# Patient Record
Sex: Female | Born: 1935 | Race: White | Hispanic: No | Marital: Married
Health system: Southern US, Community
[De-identification: ages and names within clinical notes are randomized; demographics above are authoritative.]

## PROBLEM LIST (undated history)

## (undated) DIAGNOSIS — R413 Other amnesia: Secondary | ICD-10-CM

## (undated) DIAGNOSIS — F32A Depression, unspecified: Secondary | ICD-10-CM

## (undated) DIAGNOSIS — I639 Cerebral infarction, unspecified: Secondary | ICD-10-CM

## (undated) DIAGNOSIS — F329 Major depressive disorder, single episode, unspecified: Secondary | ICD-10-CM

## (undated) DIAGNOSIS — R112 Nausea with vomiting, unspecified: Secondary | ICD-10-CM

## (undated) DIAGNOSIS — H5347 Heteronymous bilateral field defects: Secondary | ICD-10-CM

## (undated) DIAGNOSIS — I10 Essential (primary) hypertension: Secondary | ICD-10-CM

## (undated) DIAGNOSIS — N390 Urinary tract infection, site not specified: Secondary | ICD-10-CM

## (undated) DIAGNOSIS — R55 Syncope and collapse: Secondary | ICD-10-CM

## (undated) DIAGNOSIS — Z9889 Other specified postprocedural states: Secondary | ICD-10-CM

## (undated) HISTORY — PX: JOINT REPLACEMENT: SHX530

## (undated) HISTORY — PX: KNEE SURGERY: SHX244

## (undated) HISTORY — PX: ABDOMINAL HYSTERECTOMY: SHX81

---

## 2009-08-29 ENCOUNTER — Ambulatory Visit (HOSPITAL_COMMUNITY): Admission: RE | Admit: 2009-08-29 | Discharge: 2009-08-29 | Payer: Self-pay | Admitting: Orthopedic Surgery

## 2009-10-03 ENCOUNTER — Encounter: Payer: Self-pay | Admitting: Emergency Medicine

## 2009-10-03 ENCOUNTER — Ambulatory Visit: Payer: Self-pay | Admitting: Interventional Radiology

## 2009-10-04 ENCOUNTER — Encounter (INDEPENDENT_AMBULATORY_CARE_PROVIDER_SITE_OTHER): Payer: Self-pay | Admitting: Internal Medicine

## 2009-10-04 ENCOUNTER — Inpatient Hospital Stay (HOSPITAL_COMMUNITY): Admission: EM | Admit: 2009-10-04 | Discharge: 2009-10-04 | Payer: Self-pay

## 2009-10-07 ENCOUNTER — Encounter (INDEPENDENT_AMBULATORY_CARE_PROVIDER_SITE_OTHER): Payer: Self-pay | Admitting: Interventional Cardiology

## 2009-10-07 ENCOUNTER — Ambulatory Visit: Admission: RE | Admit: 2009-10-07 | Discharge: 2009-10-07 | Payer: Self-pay | Admitting: Interventional Cardiology

## 2009-10-07 ENCOUNTER — Ambulatory Visit: Payer: Self-pay | Admitting: Vascular Surgery

## 2010-08-03 ENCOUNTER — Encounter: Admission: RE | Admit: 2010-08-03 | Discharge: 2010-08-03 | Payer: Self-pay | Admitting: Geriatric Medicine

## 2010-09-17 ENCOUNTER — Ambulatory Visit (HOSPITAL_COMMUNITY): Admission: EM | Admit: 2010-09-17 | Discharge: 2010-01-22 | Payer: Self-pay | Admitting: Emergency Medicine

## 2010-09-24 ENCOUNTER — Ambulatory Visit (HOSPITAL_COMMUNITY)
Admission: EM | Admit: 2010-09-24 | Discharge: 2010-09-24 | Payer: Self-pay | Source: Home / Self Care | Admitting: Emergency Medicine

## 2010-12-30 LAB — BASIC METABOLIC PANEL
CO2: 24 mEq/L (ref 19–32)
Chloride: 105 mEq/L (ref 96–112)
GFR calc Af Amer: >60 mL/min (ref >60)
Sodium: 135 mEq/L (ref 135–145)

## 2010-12-30 LAB — COMPREHENSIVE METABOLIC PANEL
ALT: 22 U/L (ref 0–35)
AST: 29 U/L (ref 0–37)
Alkaline Phosphatase: 59 U/L (ref 39–117)
CO2: 22 mEq/L (ref 19–32)
Calcium: 9.2 mg/dL (ref 8.4–10.5)
Chloride: 102 mEq/L (ref 96–112)
GFR calc Af Amer: >60 mL/min (ref >60)
GFR calc non Af Amer: 59 mL/min — ABNORMAL LOW (ref >60)
Potassium: 4.1 mEq/L (ref 3.5–5.1)
Sodium: 134 mEq/L — ABNORMAL LOW (ref 135–145)

## 2010-12-30 LAB — DIFFERENTIAL
Eosinophils Absolute: 0.3 10*3/uL (ref 0.0–0.7)
Eosinophils Relative: 4 % (ref 0–5)
Lymphs Abs: 1.9 10*3/uL (ref 0.7–4.0)
Monocytes Relative: 9 % (ref 3–12)

## 2010-12-30 LAB — CBC
Hemoglobin: 11.1 g/dL — ABNORMAL LOW (ref 12.0–15.0)
MCHC: 34.3 g/dL (ref 30.0–36.0)
MCHC: 34.3 g/dL (ref 30.0–36.0)
MCV: 94.4 fL (ref 78.0–100.0)
RBC: 3.44 MIL/uL — ABNORMAL LOW (ref 3.87–5.11)
RBC: 3.91 MIL/uL (ref 3.87–5.11)
WBC: 7.9 10*3/uL (ref 4.0–10.5)

## 2010-12-30 LAB — TYPE AND SCREEN: ABO/RH(D): O POS

## 2011-01-11 LAB — URINALYSIS, ROUTINE W REFLEX MICROSCOPIC
Bilirubin Urine: NEGATIVE
Glucose, UA: NEGATIVE mg/dL
Hgb urine dipstick: NEGATIVE
Specific Gravity, Urine: 1.022 (ref 1.005–1.030)

## 2011-01-11 LAB — LIPID PANEL
HDL: 69 mg/dL (ref >39)
Total CHOL/HDL Ratio: 2 RATIO
Triglycerides: 61 mg/dL (ref <150)

## 2011-01-11 LAB — COMPREHENSIVE METABOLIC PANEL
ALT: 24 U/L (ref 0–35)
Albumin: 3.1 g/dL — ABNORMAL LOW (ref 3.5–5.2)
Alkaline Phosphatase: 56 U/L (ref 39–117)
CO2: 20 mEq/L (ref 19–32)
Calcium: 8.5 mg/dL (ref 8.4–10.5)
Chloride: 107 mEq/L (ref 96–112)
Glucose, Bld: 97 mg/dL (ref 70–99)
Total Protein: 5.5 g/dL — ABNORMAL LOW (ref 6.0–8.3)

## 2011-01-11 LAB — BASIC METABOLIC PANEL
CO2: 26 mEq/L (ref 19–32)
Calcium: 9 mg/dL (ref 8.4–10.5)
Chloride: 102 mEq/L (ref 96–112)
Creatinine, Ser: 1.2 mg/dL (ref 0.4–1.2)
Glucose, Bld: 98 mg/dL (ref 70–99)

## 2011-01-11 LAB — DIFFERENTIAL
Basophils Absolute: 0.1 10*3/uL (ref 0.0–0.1)
Basophils Relative: 1 % (ref 0–1)
Eosinophils Absolute: 0.3 10*3/uL (ref 0.0–0.7)
Monocytes Absolute: 0.5 10*3/uL (ref 0.1–1.0)
Monocytes Relative: 8 % (ref 3–12)
Neutro Abs: 3.9 10*3/uL (ref 1.7–7.7)
Neutrophils Relative %: 64 % (ref 43–77)

## 2011-01-11 LAB — URINE CULTURE

## 2011-01-11 LAB — FERRITIN: Ferritin: 97 ng/mL (ref 10–291)

## 2011-01-11 LAB — URINE MICROSCOPIC-ADD ON

## 2011-01-11 LAB — CBC
HCT: 31.1 % — ABNORMAL LOW (ref 36.0–46.0)
MCHC: 34.5 g/dL (ref 30.0–36.0)
MCV: 95.7 fL (ref 78.0–100.0)
MCV: 95.9 fL (ref 78.0–100.0)
RBC: 3.26 MIL/uL — ABNORMAL LOW (ref 3.87–5.11)
RBC: 3.62 MIL/uL — ABNORMAL LOW (ref 3.87–5.11)
RDW: 12.3 % (ref 11.5–15.5)
WBC: 6 10*3/uL (ref 4.0–10.5)

## 2011-01-11 LAB — CARDIAC PANEL(CRET KIN+CKTOT+MB+TROPI)
Relative Index: INVALID (ref 0.0–2.5)
Troponin I: 0.01 ng/mL (ref 0.00–0.06)

## 2011-01-11 LAB — VITAMIN B12: Vitamin B-12: 640 pg/mL (ref 211–911)

## 2011-01-11 LAB — RETICULOCYTES
RBC.: 3.4 MIL/uL — ABNORMAL LOW (ref 3.87–5.11)
Retic Count, Absolute: 37.4 10*3/uL (ref 19.0–186.0)
Retic Ct Pct: 1.1 % (ref 0.4–3.1)

## 2011-01-11 LAB — POCT CARDIAC MARKERS
CKMB, poc: 1.8 ng/mL (ref 1.0–8.0)
Myoglobin, poc: 81.9 ng/mL (ref 12–200)

## 2011-01-11 LAB — TSH: TSH: 2.059 u[IU]/mL (ref 0.350–4.500)

## 2011-01-11 LAB — IRON AND TIBC: UIBC: 219 ug/dL

## 2011-07-12 DIAGNOSIS — N390 Urinary tract infection, site not specified: Secondary | ICD-10-CM

## 2011-07-12 HISTORY — DX: Urinary tract infection, site not specified: N39.0

## 2011-08-30 ENCOUNTER — Encounter (HOSPITAL_COMMUNITY): Payer: Self-pay | Admitting: Gastroenterology

## 2011-08-30 ENCOUNTER — Ambulatory Visit (HOSPITAL_COMMUNITY)
Admission: RE | Admit: 2011-08-30 | Discharge: 2011-08-30 | Disposition: A | Discharge status: Hospice | Payer: Medicare Other | Source: Ambulatory Visit | Attending: Gastroenterology | Admitting: Gastroenterology

## 2011-08-30 ENCOUNTER — Encounter (HOSPITAL_COMMUNITY)
Admission: RE | Disposition: A | Discharge status: Hospice | Payer: Self-pay | Source: Ambulatory Visit | Attending: Gastroenterology

## 2011-08-30 DIAGNOSIS — R1310 Dysphagia, unspecified: Secondary | ICD-10-CM | POA: Insufficient documentation

## 2011-08-30 DIAGNOSIS — K222 Esophageal obstruction: Secondary | ICD-10-CM | POA: Insufficient documentation

## 2011-08-30 HISTORY — DX: Urinary tract infection, site not specified: N39.0

## 2011-08-30 HISTORY — DX: Cerebral infarction, unspecified: I63.9

## 2011-08-30 HISTORY — PX: BALLOON DILATION: SHX5330

## 2011-08-30 HISTORY — DX: Syncope and collapse: R55

## 2011-08-30 HISTORY — DX: Heteronymous bilateral field defects: H53.47

## 2011-08-30 HISTORY — DX: Essential (primary) hypertension: I10

## 2011-08-30 HISTORY — PX: ESOPHAGOGASTRODUODENOSCOPY: SHX5428

## 2011-08-30 HISTORY — DX: Major depressive disorder, single episode, unspecified: F32.9

## 2011-08-30 HISTORY — DX: Other amnesia: R41.3

## 2011-08-30 HISTORY — DX: Depression, unspecified: F32.A

## 2011-08-30 SURGERY — EGD (ESOPHAGOGASTRODUODENOSCOPY)
Anesthesia: Moderate Sedation

## 2011-08-30 MED ORDER — FENTANYL NICU IV SYRINGE 50 MCG/ML
INJECTION | INTRAMUSCULAR | Status: DC | PRN
  Administered 2011-08-30: 25 ug via INTRAVENOUS

## 2011-08-30 MED ORDER — BUTAMBEN-TETRACAINE-BENZOCAINE 2-2-14 % EX AERO
INHALATION_SPRAY | CUTANEOUS | Status: DC | PRN
  Administered 2011-08-30: 2 via TOPICAL

## 2011-08-30 MED ORDER — MIDAZOLAM HCL 10 MG/2ML IJ SOLN
INTRAMUSCULAR | Status: DC | PRN
  Administered 2011-08-30: 2 mg via INTRAVENOUS
  Administered 2011-08-30: 1 mg via INTRAVENOUS

## 2011-08-30 MED ORDER — MIDAZOLAM HCL 10 MG/2ML IJ SOLN
INTRAMUSCULAR | Status: AC
  Filled 2011-08-30: qty 4

## 2011-08-30 MED ORDER — SODIUM CHLORIDE 0.9 % IV SOLN
INTRAVENOUS | Status: DC
  Administered 2011-08-30: 14:00:00 via INTRAVENOUS

## 2011-08-30 MED ORDER — FENTANYL CITRATE 0.05 MG/ML IJ SOLN
INTRAMUSCULAR | Status: AC
  Filled 2011-08-30: qty 4

## 2011-08-30 MED ORDER — OMEPRAZOLE 20 MG PO CPDR: 20.0000 mg | DELAYED_RELEASE_CAPSULE | Freq: Every day | ORAL | Status: AC

## 2011-08-30 NOTE — Brief Op Note (Signed)
See endo pro for note.

## 2011-08-30 NOTE — H&P (Signed)
See H and P from August 27, 2011. No change.

## 2011-08-31 ENCOUNTER — Encounter (HOSPITAL_COMMUNITY): Payer: Self-pay

## 2011-09-07 ENCOUNTER — Encounter (HOSPITAL_COMMUNITY): Payer: Self-pay | Admitting: Gastroenterology

## 2011-10-26 DIAGNOSIS — H2510 Age-related nuclear cataract, unspecified eye: Secondary | ICD-10-CM | POA: Diagnosis not present

## 2011-11-08 DIAGNOSIS — IMO0002 Reserved for concepts with insufficient information to code with codable children: Secondary | ICD-10-CM | POA: Diagnosis not present

## 2011-11-08 DIAGNOSIS — H269 Unspecified cataract: Secondary | ICD-10-CM | POA: Diagnosis not present

## 2011-11-08 DIAGNOSIS — H2510 Age-related nuclear cataract, unspecified eye: Secondary | ICD-10-CM | POA: Diagnosis not present

## 2011-11-12 DIAGNOSIS — B351 Tinea unguium: Secondary | ICD-10-CM | POA: Diagnosis not present

## 2011-11-12 DIAGNOSIS — M79609 Pain in unspecified limb: Secondary | ICD-10-CM | POA: Diagnosis not present

## 2011-12-06 DIAGNOSIS — H2510 Age-related nuclear cataract, unspecified eye: Secondary | ICD-10-CM | POA: Diagnosis not present

## 2011-12-20 DIAGNOSIS — IMO0002 Reserved for concepts with insufficient information to code with codable children: Secondary | ICD-10-CM | POA: Diagnosis not present

## 2011-12-20 DIAGNOSIS — H2510 Age-related nuclear cataract, unspecified eye: Secondary | ICD-10-CM | POA: Diagnosis not present

## 2011-12-20 DIAGNOSIS — H269 Unspecified cataract: Secondary | ICD-10-CM | POA: Diagnosis not present

## 2012-01-14 DIAGNOSIS — M79609 Pain in unspecified limb: Secondary | ICD-10-CM | POA: Diagnosis not present

## 2012-01-14 DIAGNOSIS — B351 Tinea unguium: Secondary | ICD-10-CM | POA: Diagnosis not present

## 2012-03-28 DIAGNOSIS — Z79899 Other long term (current) drug therapy: Secondary | ICD-10-CM | POA: Diagnosis not present

## 2012-03-28 DIAGNOSIS — N951 Menopausal and female climacteric states: Secondary | ICD-10-CM | POA: Diagnosis not present

## 2012-03-28 DIAGNOSIS — R413 Other amnesia: Secondary | ICD-10-CM | POA: Diagnosis not present

## 2012-03-28 DIAGNOSIS — I10 Essential (primary) hypertension: Secondary | ICD-10-CM | POA: Diagnosis not present

## 2012-03-31 DIAGNOSIS — B351 Tinea unguium: Secondary | ICD-10-CM | POA: Diagnosis not present

## 2012-03-31 DIAGNOSIS — M79609 Pain in unspecified limb: Secondary | ICD-10-CM | POA: Diagnosis not present

## 2012-05-26 ENCOUNTER — Ambulatory Visit (HOSPITAL_COMMUNITY)
Admission: EM | Admit: 2012-05-26 | Discharge: 2012-05-26 | Disposition: A | Discharge status: Home / Self Care | Payer: Medicare Other | Attending: Gastroenterology | Admitting: Gastroenterology

## 2012-05-26 ENCOUNTER — Encounter (HOSPITAL_COMMUNITY): Payer: Self-pay | Admitting: Gastroenterology

## 2012-05-26 ENCOUNTER — Encounter (HOSPITAL_COMMUNITY): Admission: EM | Disposition: A | Discharge status: Home / Self Care | Payer: Self-pay | Source: Home / Self Care

## 2012-05-26 DIAGNOSIS — K449 Diaphragmatic hernia without obstruction or gangrene: Secondary | ICD-10-CM | POA: Insufficient documentation

## 2012-05-26 DIAGNOSIS — T18108A Unspecified foreign body in esophagus causing other injury, initial encounter: Secondary | ICD-10-CM | POA: Diagnosis not present

## 2012-05-26 DIAGNOSIS — K222 Esophageal obstruction: Secondary | ICD-10-CM | POA: Insufficient documentation

## 2012-05-26 DIAGNOSIS — IMO0002 Reserved for concepts with insufficient information to code with codable children: Secondary | ICD-10-CM | POA: Insufficient documentation

## 2012-05-26 HISTORY — PX: ESOPHAGOGASTRODUODENOSCOPY: SHX5428

## 2012-05-26 SURGERY — EGD (ESOPHAGOGASTRODUODENOSCOPY)
Anesthesia: Moderate Sedation

## 2012-05-26 MED ORDER — MIDAZOLAM HCL 10 MG/2ML IJ SOLN
INTRAMUSCULAR | Status: DC | PRN
  Administered 2012-05-26 (×3): 1 mg via INTRAVENOUS

## 2012-05-26 MED ORDER — SODIUM CHLORIDE 0.9 % IV SOLN: INTRAVENOUS | Status: DC

## 2012-05-26 MED ORDER — MIDAZOLAM HCL 5 MG/ML IJ SOLN
INTRAMUSCULAR | Status: AC
  Filled 2012-05-26: qty 2

## 2012-05-26 MED ORDER — FENTANYL CITRATE 0.05 MG/ML IJ SOLN
INTRAMUSCULAR | Status: AC
  Filled 2012-05-26: qty 2

## 2012-05-26 MED ORDER — SODIUM CHLORIDE 0.9 % IV SOLN
INTRAVENOUS | Status: DC
  Administered 2012-05-26: 250 mL via INTRAVENOUS

## 2012-05-26 MED ORDER — FENTANYL CITRATE 0.05 MG/ML IJ SOLN
INTRAMUSCULAR | Status: DC | PRN
  Administered 2012-05-26: 25 ug via INTRAVENOUS

## 2012-05-26 NOTE — H&P (Signed)
Interval history and physical for outpatient endoscopic procedure  The patient is an 78 year old female who was eating dinner this evening and then could not swallow. Her son who is a physician called me. The patient does have a history of having had a foreign body (meat impaction) removed from her esophagus with an esophageal dilatation about a year and a half ago.  She is in no acute distress  Heart regular rhythm no murmurs  Lungs clear  Abdomen: Soft nontender  Impression: Esophageal foreign body per history  Plan: upper endoscopy with foreign body removal and possible esophageal dilatation

## 2012-05-26 NOTE — Op Note (Signed)
Esophagogastroduodenoscopy with esophageal foreign body removal and esophageal dilatation  Indication: Dysphagia, esophageal foreign body, history of esophageal stricture  Premedications: Fentanyl 25 mcg IV, Versed 3 mg IV  Procedure: With the patient in the left lateral decubitus position the Pentax gastroscope was inserted into the oropharynx and passed into the esophagus. Midway down the esophagus there was food and liquid present. Much of this was able to be suctioned. I was able to pass the scope down to the distal esophagus getting around all of the food and then I could see the esophagogastric junction area which appeared to be strictured. I could not advance the scope into the stomach. I therefore advanced a balloon dilator and placed at the stricture at the EG junction and dilated to 10 mm. I was unable to get the scope into the stomach and down into the duodenum. The second portion of bulb of the duodenum were normal. The stomach had normal-appearing mucosa. There was a moderate-sized hiatal hernia noted on retroflexion. The scope was then brought back up into the esophagus where there was still a fair amount of food present in the esophagus was cleaned out watching the food and pushing the food down into the stomach. The esophagus was cleared.  Impression: Meat impaction and food impaction in the esophagus                      Distal esophageal stricture dilated to 10 mm                       Hiatal hernia  Plan: I recommended to the patient's husband and family that she should not eat chunks of meat. She should basically eat softer foods that will pass easily. Consider elective repeat esophageal dilatation.

## 2012-05-29 ENCOUNTER — Encounter (HOSPITAL_COMMUNITY): Payer: Self-pay | Admitting: Gastroenterology

## 2012-05-29 ENCOUNTER — Encounter (HOSPITAL_COMMUNITY): Payer: Self-pay

## 2012-06-02 DIAGNOSIS — M79609 Pain in unspecified limb: Secondary | ICD-10-CM | POA: Diagnosis not present

## 2012-06-02 DIAGNOSIS — B351 Tinea unguium: Secondary | ICD-10-CM | POA: Diagnosis not present

## 2012-07-19 DIAGNOSIS — L259 Unspecified contact dermatitis, unspecified cause: Secondary | ICD-10-CM | POA: Diagnosis not present

## 2012-07-24 DIAGNOSIS — H353190 Nonexudative age-related macular degeneration, unspecified eye, stage unspecified: Secondary | ICD-10-CM | POA: Diagnosis not present

## 2012-08-04 DIAGNOSIS — B351 Tinea unguium: Secondary | ICD-10-CM | POA: Diagnosis not present

## 2012-08-04 DIAGNOSIS — M79609 Pain in unspecified limb: Secondary | ICD-10-CM | POA: Diagnosis not present

## 2012-10-06 DIAGNOSIS — B351 Tinea unguium: Secondary | ICD-10-CM | POA: Diagnosis not present

## 2012-10-06 DIAGNOSIS — M79609 Pain in unspecified limb: Secondary | ICD-10-CM | POA: Diagnosis not present

## 2012-10-09 DIAGNOSIS — Z0000 Encounter for general adult medical examination without abnormal findings: Secondary | ICD-10-CM | POA: Diagnosis not present

## 2012-10-09 DIAGNOSIS — Z79899 Other long term (current) drug therapy: Secondary | ICD-10-CM | POA: Diagnosis not present

## 2012-10-09 DIAGNOSIS — Z1331 Encounter for screening for depression: Secondary | ICD-10-CM | POA: Diagnosis not present

## 2012-10-09 DIAGNOSIS — R413 Other amnesia: Secondary | ICD-10-CM | POA: Diagnosis not present

## 2012-10-09 DIAGNOSIS — R197 Diarrhea, unspecified: Secondary | ICD-10-CM | POA: Diagnosis not present

## 2012-11-03 ENCOUNTER — Other Ambulatory Visit (HOSPITAL_BASED_OUTPATIENT_CLINIC_OR_DEPARTMENT_OTHER): Payer: Self-pay | Admitting: Family Medicine

## 2012-11-03 ENCOUNTER — Ambulatory Visit (HOSPITAL_BASED_OUTPATIENT_CLINIC_OR_DEPARTMENT_OTHER)
Admission: RE | Admit: 2012-11-03 | Discharge: 2012-11-03 | Disposition: A | Discharge status: Home / Self Care | Payer: Medicare Other | Source: Ambulatory Visit | Attending: Family Medicine | Admitting: Family Medicine

## 2012-11-03 DIAGNOSIS — S92919A Unspecified fracture of unspecified toe(s), initial encounter for closed fracture: Secondary | ICD-10-CM | POA: Diagnosis not present

## 2012-11-03 DIAGNOSIS — S1093XA Contusion of unspecified part of neck, initial encounter: Secondary | ICD-10-CM | POA: Diagnosis not present

## 2012-11-03 DIAGNOSIS — S99929A Unspecified injury of unspecified foot, initial encounter: Secondary | ICD-10-CM | POA: Diagnosis not present

## 2012-11-03 DIAGNOSIS — S0003XA Contusion of scalp, initial encounter: Secondary | ICD-10-CM | POA: Diagnosis not present

## 2012-11-03 DIAGNOSIS — S8990XA Unspecified injury of unspecified lower leg, initial encounter: Secondary | ICD-10-CM | POA: Diagnosis not present

## 2012-11-03 DIAGNOSIS — S0083XA Contusion of other part of head, initial encounter: Secondary | ICD-10-CM | POA: Diagnosis not present

## 2012-11-03 DIAGNOSIS — IMO0002 Reserved for concepts with insufficient information to code with codable children: Secondary | ICD-10-CM | POA: Diagnosis not present

## 2012-11-03 DIAGNOSIS — X58XXXA Exposure to other specified factors, initial encounter: Secondary | ICD-10-CM | POA: Insufficient documentation

## 2012-12-08 DIAGNOSIS — M79609 Pain in unspecified limb: Secondary | ICD-10-CM | POA: Diagnosis not present

## 2013-01-04 ENCOUNTER — Ambulatory Visit
Admission: RE | Admit: 2013-01-04 | Discharge: 2013-01-04 | Disposition: A | Discharge status: Home / Self Care | Payer: Medicare Other | Source: Ambulatory Visit | Attending: Family Medicine | Admitting: Family Medicine

## 2013-01-04 ENCOUNTER — Other Ambulatory Visit: Payer: Self-pay | Admitting: Family Medicine

## 2013-01-04 DIAGNOSIS — R1310 Dysphagia, unspecified: Secondary | ICD-10-CM

## 2013-01-04 DIAGNOSIS — K228 Other specified diseases of esophagus: Secondary | ICD-10-CM | POA: Diagnosis not present

## 2013-01-05 DIAGNOSIS — K222 Esophageal obstruction: Secondary | ICD-10-CM | POA: Diagnosis not present

## 2013-01-10 ENCOUNTER — Other Ambulatory Visit: Payer: Self-pay | Admitting: Gastroenterology

## 2013-01-11 ENCOUNTER — Encounter (HOSPITAL_COMMUNITY): Payer: Self-pay

## 2013-01-11 ENCOUNTER — Ambulatory Visit (HOSPITAL_COMMUNITY)
Admission: RE | Admit: 2013-01-11 | Discharge: 2013-01-11 | Disposition: A | Discharge status: Home / Self Care | Payer: Medicare Other | Source: Ambulatory Visit | Attending: Gastroenterology | Admitting: Gastroenterology

## 2013-01-11 ENCOUNTER — Encounter (HOSPITAL_COMMUNITY)
Admission: RE | Disposition: A | Discharge status: Home / Self Care | Payer: Self-pay | Source: Ambulatory Visit | Attending: Gastroenterology

## 2013-01-11 DIAGNOSIS — K449 Diaphragmatic hernia without obstruction or gangrene: Secondary | ICD-10-CM | POA: Diagnosis not present

## 2013-01-11 DIAGNOSIS — K222 Esophageal obstruction: Secondary | ICD-10-CM | POA: Diagnosis not present

## 2013-01-11 DIAGNOSIS — R1310 Dysphagia, unspecified: Secondary | ICD-10-CM | POA: Insufficient documentation

## 2013-01-11 HISTORY — DX: Other specified postprocedural states: R11.2

## 2013-01-11 HISTORY — PX: ESOPHAGOGASTRODUODENOSCOPY: SHX5428

## 2013-01-11 HISTORY — DX: Other specified postprocedural states: Z98.890

## 2013-01-11 HISTORY — PX: BALLOON DILATION: SHX5330

## 2013-01-11 SURGERY — EGD (ESOPHAGOGASTRODUODENOSCOPY)
Anesthesia: Moderate Sedation

## 2013-01-11 MED ORDER — SODIUM CHLORIDE 0.9 % IV SOLN: INTRAVENOUS | Status: DC

## 2013-01-11 MED ORDER — FENTANYL CITRATE 0.05 MG/ML IJ SOLN
INTRAMUSCULAR | Status: DC | PRN
  Administered 2013-01-11: 25 ug via INTRAVENOUS

## 2013-01-11 MED ORDER — BUTAMBEN-TETRACAINE-BENZOCAINE 2-2-14 % EX AERO
INHALATION_SPRAY | CUTANEOUS | Status: DC | PRN
  Administered 2013-01-11: 2 via TOPICAL

## 2013-01-11 MED ORDER — FENTANYL CITRATE 0.05 MG/ML IJ SOLN
INTRAMUSCULAR | Status: AC
  Filled 2013-01-11: qty 4

## 2013-01-11 MED ORDER — MIDAZOLAM HCL 10 MG/2ML IJ SOLN
INTRAMUSCULAR | Status: DC | PRN
  Administered 2013-01-11 (×2): 1 mg via INTRAVENOUS
  Administered 2013-01-11: 2 mg via INTRAVENOUS

## 2013-01-11 MED ORDER — MIDAZOLAM HCL 10 MG/2ML IJ SOLN
INTRAMUSCULAR | Status: AC
  Filled 2013-01-11: qty 4

## 2013-01-11 NOTE — Op Note (Signed)
[facility] [address] [place], [address]   ENDOSCOPY PROCEDURE REPORT  PATIENT: Linda Raymond, Linda Raymond  MR#: [id number] BIRTHDATE: March 30, 1922 , 89  yrs. old GENDER: Female ENDOSCOPIST: Wandalee Ferdinand, MD REFERRED BY: PROCEDURE DATE:  01/11/2013 PROCEDURE: ASA CLASS: 3 INDICATIONS: dysphagia, distal esophageal stenosis MEDICATIONS: fentanyl 25 mcg IV, Versed 4 mg IV TOPICAL ANESTHETIC: Cetacaine spray  DESCRIPTION OF PROCEDURE:   After the risks benefits and alternatives of the procedure were thoroughly explained, informed consent was obtained.  The Pentax       endoscope was introduced through the mouth and advanced to the second portion of the duodenum      , limited by Without limitations.   The instrument was slowly withdrawn as the mucosa was fully examined.      FINDINGS:  At 35 cm from the incisor teeth region the esophageal lumen narrowed. This looked like a smooth ring like narrowing. There was a little resistance to the passage of the scope, however I was able to get the scope into the stomach. The scope was then advanced down into the duodenum. There were no lesions seen in the duodenum or in the stomach. There was a hiatal hernia. The scope was then brought back up and an endoscopic balloon dilator was advanced down the scope and appropriately placed at the level of the stenosis. Dilation to 10 mm, then 12 mm was done. The balloon dilator was held in place at each level for 1 minute. Of note is that 12 mm is the level that I took her to the last time in 2012, and she did well after that.  COMPLICATIONS:none  ENDOSCOPIC IMPRESSION:distal esophageal stenosis dilated to 12 mm   RECOMMENDATIONS:observation and see what the responses to the dilatation.   REPEAT EXAM: when necessary   _______________________________ Rhodia Albright, MD 01/11/2013 10:44 AM

## 2013-01-11 NOTE — H&P (Signed)
History: The patient is an 79 year old female with a history of dysphagia, and also a history of a distal esophageal stricture. She presents today for outpatient EGD with balloon dilatation of the distal esophageal stricture.  Physical: She is in no distress  Heart regular rhythm no murmurs  Lungs clear  Abdomen is soft and nontender  Impression: Distal esophageal stricture with dysphagia.  Plan: EGD with dilatation

## 2013-01-12 ENCOUNTER — Encounter (HOSPITAL_COMMUNITY): Payer: Self-pay | Admitting: Gastroenterology

## 2013-01-12 NOTE — Addendum Note (Signed)
Addended by: Herbert Moors on: 01/12/2013 10:00 AM   Modules accepted: Orders

## 2013-02-09 DIAGNOSIS — B351 Tinea unguium: Secondary | ICD-10-CM | POA: Diagnosis not present

## 2013-02-09 DIAGNOSIS — M79609 Pain in unspecified limb: Secondary | ICD-10-CM | POA: Diagnosis not present

## 2013-04-09 DIAGNOSIS — I10 Essential (primary) hypertension: Secondary | ICD-10-CM | POA: Diagnosis not present

## 2013-04-09 DIAGNOSIS — Z79899 Other long term (current) drug therapy: Secondary | ICD-10-CM | POA: Diagnosis not present

## 2013-04-09 DIAGNOSIS — R413 Other amnesia: Secondary | ICD-10-CM | POA: Diagnosis not present

## 2013-04-13 DIAGNOSIS — B351 Tinea unguium: Secondary | ICD-10-CM | POA: Diagnosis not present

## 2013-04-13 DIAGNOSIS — M79609 Pain in unspecified limb: Secondary | ICD-10-CM | POA: Diagnosis not present

## 2013-07-23 DIAGNOSIS — Z23 Encounter for immunization: Secondary | ICD-10-CM | POA: Diagnosis not present

## 2013-08-10 DIAGNOSIS — B351 Tinea unguium: Secondary | ICD-10-CM | POA: Diagnosis not present

## 2013-08-10 DIAGNOSIS — M79609 Pain in unspecified limb: Secondary | ICD-10-CM | POA: Diagnosis not present

## 2013-10-19 DIAGNOSIS — B351 Tinea unguium: Secondary | ICD-10-CM | POA: Diagnosis not present

## 2013-10-19 DIAGNOSIS — M79609 Pain in unspecified limb: Secondary | ICD-10-CM | POA: Diagnosis not present

## 2013-10-26 DIAGNOSIS — Z0000 Encounter for general adult medical examination without abnormal findings: Secondary | ICD-10-CM | POA: Diagnosis not present

## 2013-10-26 DIAGNOSIS — L989 Disorder of the skin and subcutaneous tissue, unspecified: Secondary | ICD-10-CM | POA: Diagnosis not present

## 2013-10-26 DIAGNOSIS — Z79899 Other long term (current) drug therapy: Secondary | ICD-10-CM | POA: Diagnosis not present

## 2013-10-26 DIAGNOSIS — F3289 Other specified depressive episodes: Secondary | ICD-10-CM | POA: Diagnosis not present

## 2013-10-26 DIAGNOSIS — F329 Major depressive disorder, single episode, unspecified: Secondary | ICD-10-CM | POA: Diagnosis not present

## 2013-10-26 DIAGNOSIS — F0280 Dementia in other diseases classified elsewhere without behavioral disturbance: Secondary | ICD-10-CM | POA: Diagnosis not present

## 2013-12-21 DIAGNOSIS — B351 Tinea unguium: Secondary | ICD-10-CM | POA: Diagnosis not present

## 2013-12-21 DIAGNOSIS — M79609 Pain in unspecified limb: Secondary | ICD-10-CM | POA: Diagnosis not present

## 2014-03-01 DIAGNOSIS — M79609 Pain in unspecified limb: Secondary | ICD-10-CM | POA: Diagnosis not present

## 2014-03-01 DIAGNOSIS — B351 Tinea unguium: Secondary | ICD-10-CM | POA: Diagnosis not present

## 2014-04-26 DIAGNOSIS — G309 Alzheimer's disease, unspecified: Secondary | ICD-10-CM | POA: Diagnosis not present

## 2014-04-26 DIAGNOSIS — F0280 Dementia in other diseases classified elsewhere without behavioral disturbance: Secondary | ICD-10-CM | POA: Diagnosis not present

## 2014-05-10 DIAGNOSIS — B351 Tinea unguium: Secondary | ICD-10-CM | POA: Diagnosis not present

## 2014-05-10 DIAGNOSIS — M79609 Pain in unspecified limb: Secondary | ICD-10-CM | POA: Diagnosis not present

## 2014-06-20 DIAGNOSIS — J384 Edema of larynx: Secondary | ICD-10-CM | POA: Diagnosis not present

## 2014-06-20 DIAGNOSIS — T6391XA Toxic effect of contact with unspecified venomous animal, accidental (unintentional), initial encounter: Secondary | ICD-10-CM | POA: Diagnosis not present

## 2014-09-20 DIAGNOSIS — M79675 Pain in left toe(s): Secondary | ICD-10-CM | POA: Diagnosis not present

## 2014-09-20 DIAGNOSIS — B351 Tinea unguium: Secondary | ICD-10-CM | POA: Diagnosis not present

## 2014-10-03 NOTE — Addendum Note (Signed)
Addended by: Prajna Vanderpool on: 10/03/2014 10:40 AM   Modules accepted: Orders  

## 2014-10-05 IMAGING — CT CT MAXILLOFACIAL W/O CM
3 series · 16 of 47 positions shown, 19 images · non-contrast
Comparison: Head CT 10/03/2009.

CLINICAL DATA: Fell.  Large hematoma over the right maxilla area.

CT MAXILLOFACIAL WITHOUT CONTRAST
TECHNIQUE: Multidetector CT imaging of the maxillofacial
structures was performed. Multiplanar CT image reconstructions were
also generated.

[Series 3: maxillofacial 2.0 h30s st · axial · 0.33mm/px · z∈[+1108,+1250]mm · 10 of 83 slices shown, 13 images]
[im 6/83  brain]
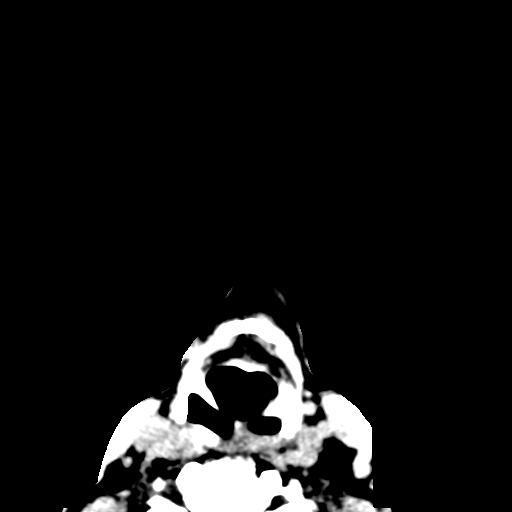
[im 6/83  bone]
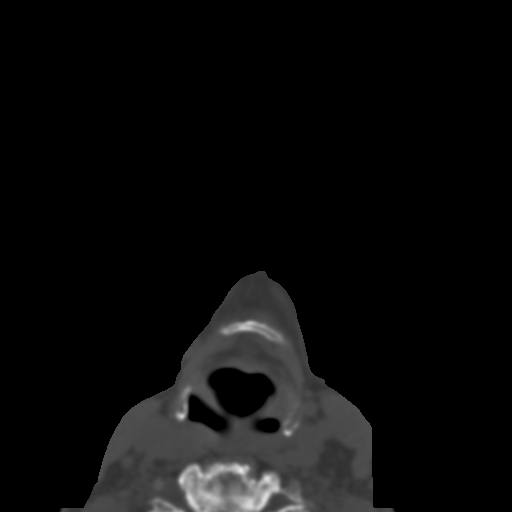
[im 15/83  bone]
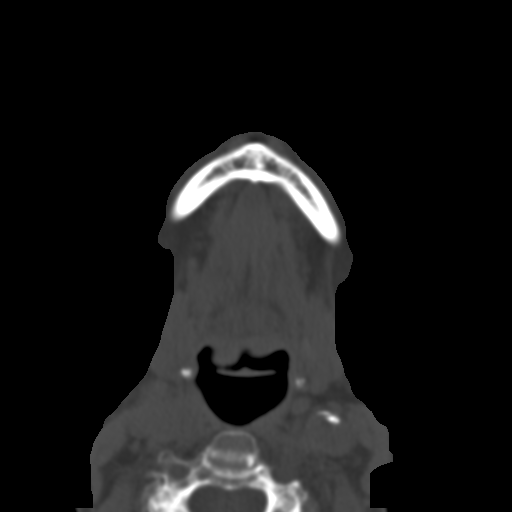
[im 23/83  bone]
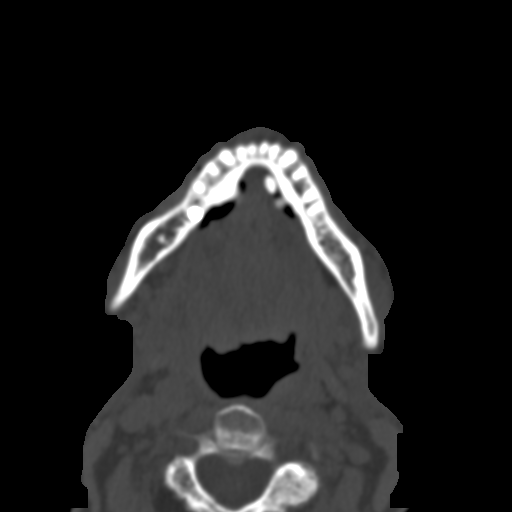
[im 29/83  bone]
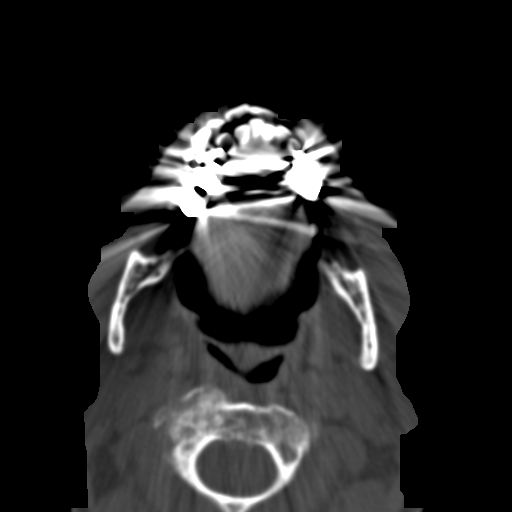
[im 37/83  brain]
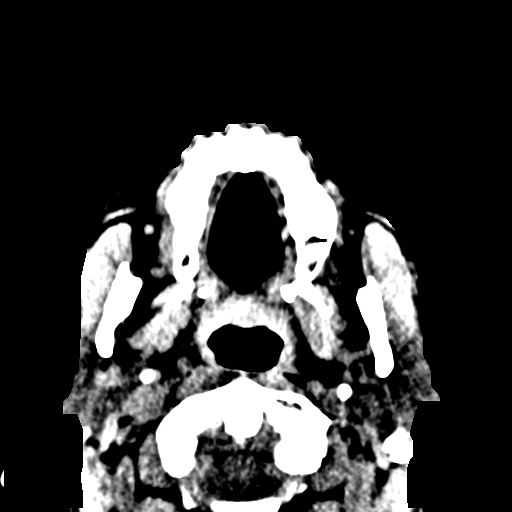
[im 37/83  bone]
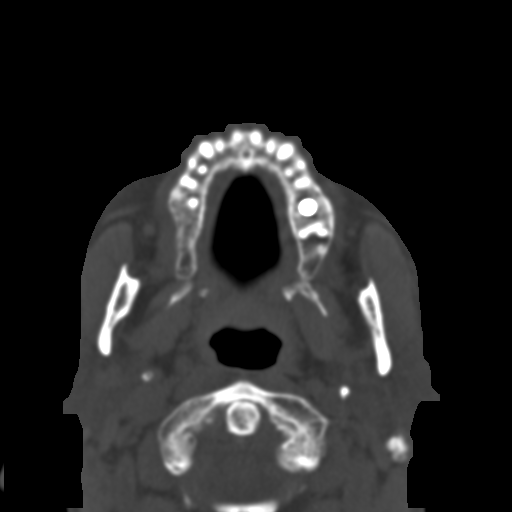
[im 46/83  bone]
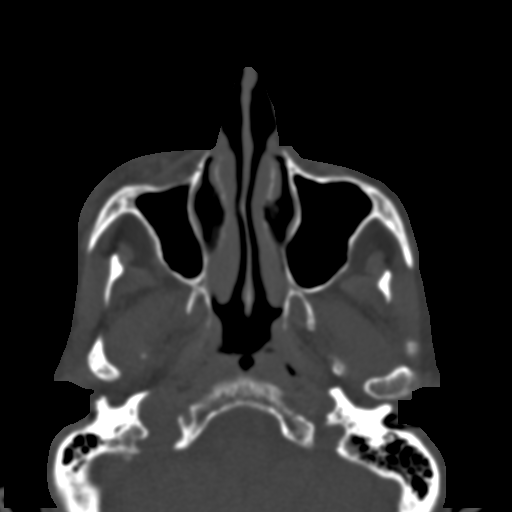
[im 54/83  bone]
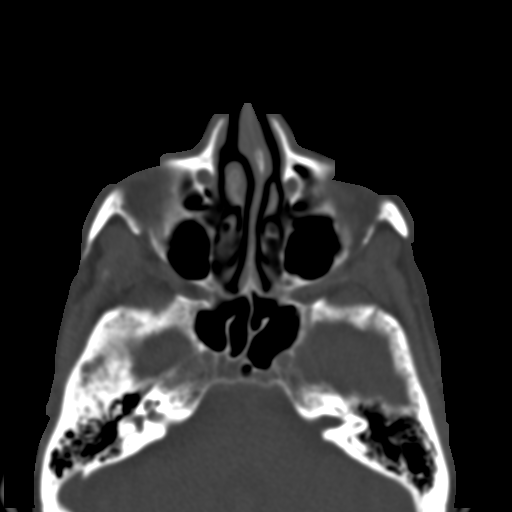
[im 63/83  bone]
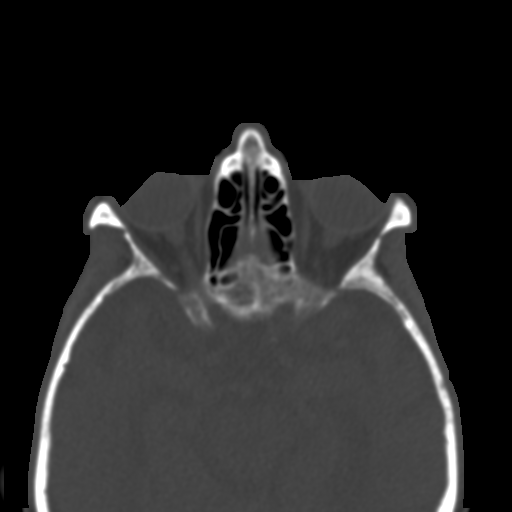
[im 68/83  brain]
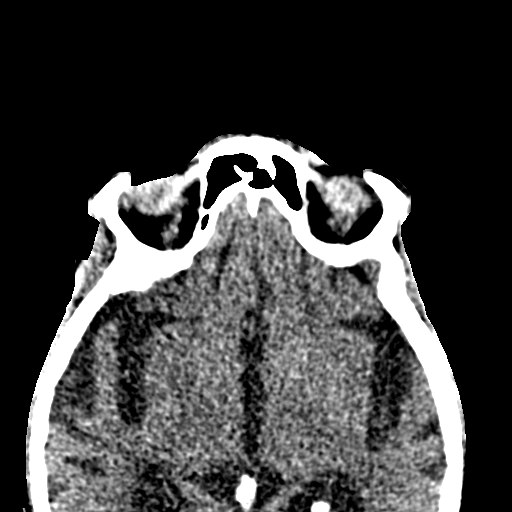
[im 68/83  bone]
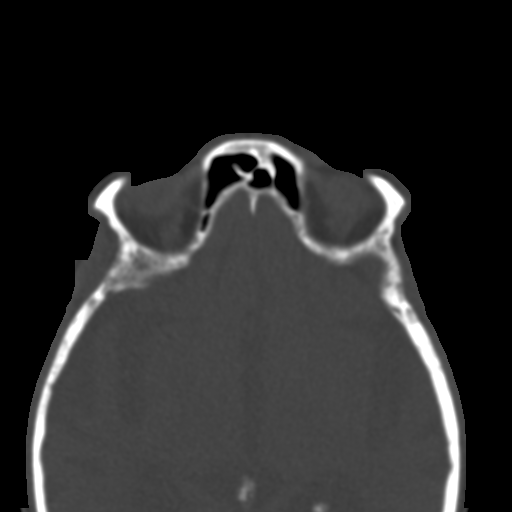
[im 77/83  bone]
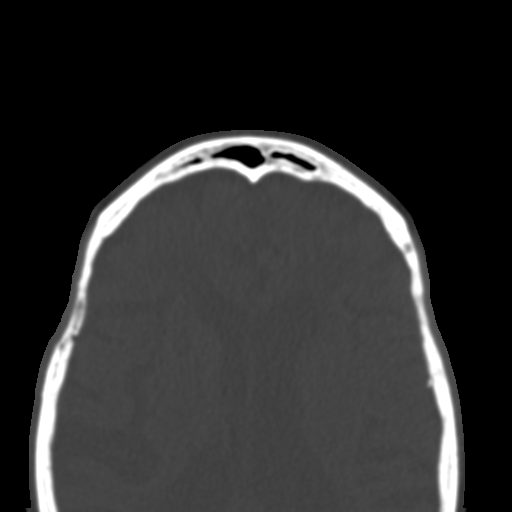

[Series 8: maxillofacial 2.0 coronal · coronal · 0.32mm/px · 3 of 78 slices shown]
[im 26/78  bone]
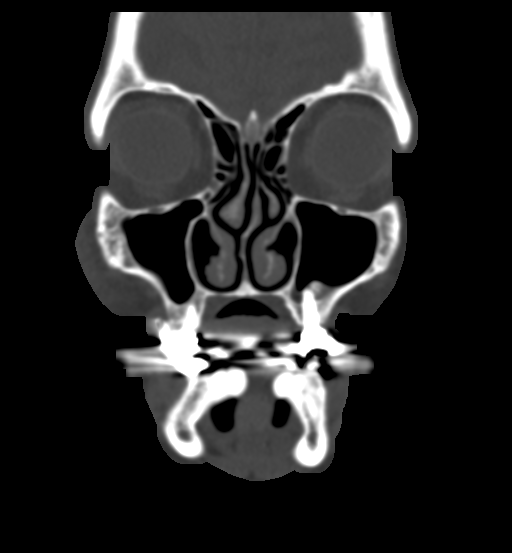
[im 35/78  bone]
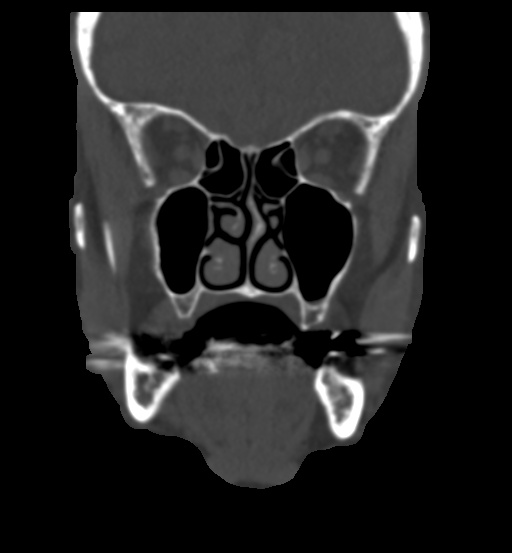
[im 43/78  bone]
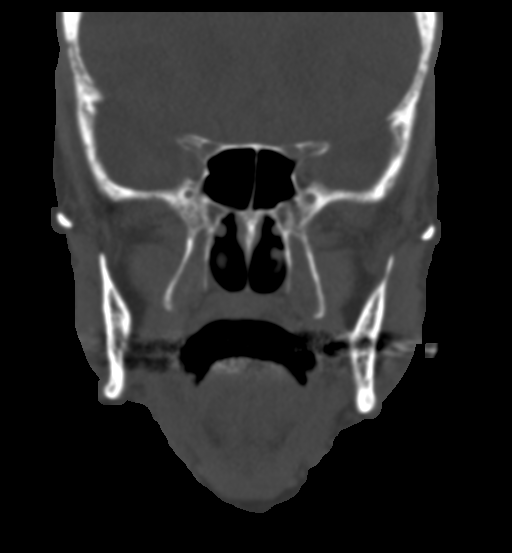

[Series 9: maxillofacial 2.0 sagittal · sagittal · 0.34mm/px · 3 of 82 slices shown]
[im 28/82  bone]
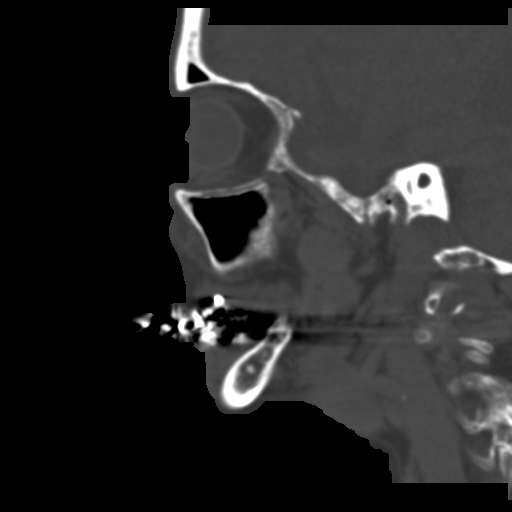
[im 41/82  bone]
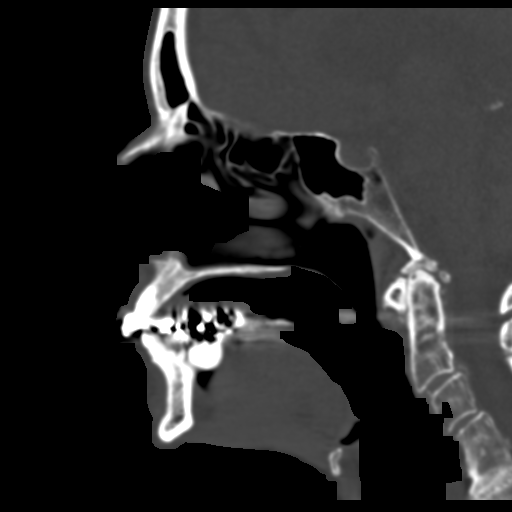
[im 55/82  bone]
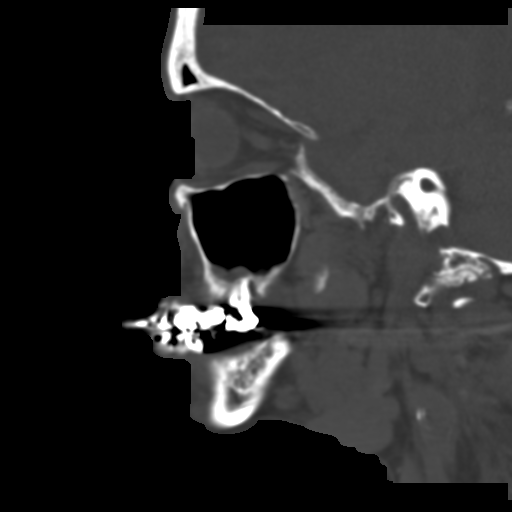

[16 of 47 positions shown; findings below may reference images not displayed]

FINDINGS: No acute facial bone fractures are identified.  The
globes are intact.  The paranasal sinuses and mastoid air cells are
clear.  The nasal bones are normal.  Incidental note is made of a
torus mandibularis.  The mandibular condyles are normally located.
Moderate degenerative changes noted on the right.
IMPRESSION: 1.  No acute facial bone fractures.
2.  Clear paranasal sinuses.
3.  Soft tissue swelling/hematoma over the right maxilla without
underlying fracture.
4.  Incidental finding of torus mandibularis.

## 2014-11-08 DIAGNOSIS — Z79899 Other long term (current) drug therapy: Secondary | ICD-10-CM | POA: Diagnosis not present

## 2014-11-08 DIAGNOSIS — K219 Gastro-esophageal reflux disease without esophagitis: Secondary | ICD-10-CM | POA: Diagnosis not present

## 2014-11-08 DIAGNOSIS — I10 Essential (primary) hypertension: Secondary | ICD-10-CM | POA: Diagnosis not present

## 2014-11-08 DIAGNOSIS — F325 Major depressive disorder, single episode, in full remission: Secondary | ICD-10-CM | POA: Diagnosis not present

## 2014-11-08 DIAGNOSIS — G301 Alzheimer's disease with late onset: Secondary | ICD-10-CM | POA: Diagnosis not present

## 2015-01-17 DIAGNOSIS — M79674 Pain in right toe(s): Secondary | ICD-10-CM | POA: Diagnosis not present

## 2015-01-17 DIAGNOSIS — B351 Tinea unguium: Secondary | ICD-10-CM | POA: Diagnosis not present

## 2015-03-21 DIAGNOSIS — B351 Tinea unguium: Secondary | ICD-10-CM | POA: Diagnosis not present

## 2015-03-21 DIAGNOSIS — M79671 Pain in right foot: Secondary | ICD-10-CM | POA: Diagnosis not present

## 2015-04-28 DIAGNOSIS — S4991XA Unspecified injury of right shoulder and upper arm, initial encounter: Secondary | ICD-10-CM | POA: Diagnosis not present

## 2015-04-28 DIAGNOSIS — R1310 Dysphagia, unspecified: Secondary | ICD-10-CM | POA: Diagnosis not present

## 2015-04-28 DIAGNOSIS — F0150 Vascular dementia without behavioral disturbance: Secondary | ICD-10-CM | POA: Diagnosis not present

## 2015-04-28 DIAGNOSIS — I672 Cerebral atherosclerosis: Secondary | ICD-10-CM | POA: Diagnosis not present

## 2015-04-28 DIAGNOSIS — S81801A Unspecified open wound, right lower leg, initial encounter: Secondary | ICD-10-CM | POA: Diagnosis not present

## 2015-04-28 DIAGNOSIS — S42009A Fracture of unspecified part of unspecified clavicle, initial encounter for closed fracture: Secondary | ICD-10-CM | POA: Diagnosis not present

## 2015-04-29 DIAGNOSIS — R1310 Dysphagia, unspecified: Secondary | ICD-10-CM | POA: Diagnosis not present

## 2015-04-29 DIAGNOSIS — I672 Cerebral atherosclerosis: Secondary | ICD-10-CM | POA: Diagnosis not present

## 2015-04-29 DIAGNOSIS — F0150 Vascular dementia without behavioral disturbance: Secondary | ICD-10-CM | POA: Diagnosis not present

## 2015-04-30 DIAGNOSIS — F0150 Vascular dementia without behavioral disturbance: Secondary | ICD-10-CM | POA: Diagnosis not present

## 2015-04-30 DIAGNOSIS — I672 Cerebral atherosclerosis: Secondary | ICD-10-CM | POA: Diagnosis not present

## 2015-04-30 DIAGNOSIS — R1310 Dysphagia, unspecified: Secondary | ICD-10-CM | POA: Diagnosis not present

## 2015-05-01 DIAGNOSIS — R1310 Dysphagia, unspecified: Secondary | ICD-10-CM | POA: Diagnosis not present

## 2015-05-01 DIAGNOSIS — F0150 Vascular dementia without behavioral disturbance: Secondary | ICD-10-CM | POA: Diagnosis not present

## 2015-05-01 DIAGNOSIS — I672 Cerebral atherosclerosis: Secondary | ICD-10-CM | POA: Diagnosis not present

## 2015-05-03 DIAGNOSIS — F0150 Vascular dementia without behavioral disturbance: Secondary | ICD-10-CM | POA: Diagnosis not present

## 2015-05-03 DIAGNOSIS — I672 Cerebral atherosclerosis: Secondary | ICD-10-CM | POA: Diagnosis not present

## 2015-05-03 DIAGNOSIS — R1310 Dysphagia, unspecified: Secondary | ICD-10-CM | POA: Diagnosis not present

## 2015-05-05 DIAGNOSIS — R1310 Dysphagia, unspecified: Secondary | ICD-10-CM | POA: Diagnosis not present

## 2015-05-05 DIAGNOSIS — F0150 Vascular dementia without behavioral disturbance: Secondary | ICD-10-CM | POA: Diagnosis not present

## 2015-05-05 DIAGNOSIS — I672 Cerebral atherosclerosis: Secondary | ICD-10-CM | POA: Diagnosis not present

## 2015-05-06 DIAGNOSIS — I672 Cerebral atherosclerosis: Secondary | ICD-10-CM | POA: Diagnosis not present

## 2015-05-06 DIAGNOSIS — F0150 Vascular dementia without behavioral disturbance: Secondary | ICD-10-CM | POA: Diagnosis not present

## 2015-05-06 DIAGNOSIS — R1310 Dysphagia, unspecified: Secondary | ICD-10-CM | POA: Diagnosis not present

## 2015-05-08 DIAGNOSIS — R1310 Dysphagia, unspecified: Secondary | ICD-10-CM | POA: Diagnosis not present

## 2015-05-08 DIAGNOSIS — I672 Cerebral atherosclerosis: Secondary | ICD-10-CM | POA: Diagnosis not present

## 2015-05-08 DIAGNOSIS — F0150 Vascular dementia without behavioral disturbance: Secondary | ICD-10-CM | POA: Diagnosis not present

## 2015-05-09 DIAGNOSIS — I6990 Unspecified sequelae of unspecified cerebrovascular disease: Secondary | ICD-10-CM | POA: Diagnosis not present

## 2015-05-09 DIAGNOSIS — S42001A Fracture of unspecified part of right clavicle, initial encounter for closed fracture: Secondary | ICD-10-CM | POA: Diagnosis not present

## 2015-05-09 DIAGNOSIS — G301 Alzheimer's disease with late onset: Secondary | ICD-10-CM | POA: Diagnosis not present

## 2015-05-09 DIAGNOSIS — I10 Essential (primary) hypertension: Secondary | ICD-10-CM | POA: Diagnosis not present

## 2015-05-09 DIAGNOSIS — F0150 Vascular dementia without behavioral disturbance: Secondary | ICD-10-CM | POA: Diagnosis not present

## 2015-05-10 DIAGNOSIS — F0150 Vascular dementia without behavioral disturbance: Secondary | ICD-10-CM | POA: Diagnosis not present

## 2015-05-10 DIAGNOSIS — R1310 Dysphagia, unspecified: Secondary | ICD-10-CM | POA: Diagnosis not present

## 2015-05-10 DIAGNOSIS — I672 Cerebral atherosclerosis: Secondary | ICD-10-CM | POA: Diagnosis not present

## 2015-05-12 DIAGNOSIS — R1310 Dysphagia, unspecified: Secondary | ICD-10-CM | POA: Diagnosis not present

## 2015-05-12 DIAGNOSIS — F0150 Vascular dementia without behavioral disturbance: Secondary | ICD-10-CM | POA: Diagnosis not present

## 2015-05-12 DIAGNOSIS — I672 Cerebral atherosclerosis: Secondary | ICD-10-CM | POA: Diagnosis not present

## 2015-05-23 DIAGNOSIS — M79674 Pain in right toe(s): Secondary | ICD-10-CM | POA: Diagnosis not present

## 2015-05-23 DIAGNOSIS — B351 Tinea unguium: Secondary | ICD-10-CM | POA: Diagnosis not present

## 2015-06-12 DIAGNOSIS — I672 Cerebral atherosclerosis: Secondary | ICD-10-CM | POA: Diagnosis not present

## 2015-06-12 DIAGNOSIS — F0150 Vascular dementia without behavioral disturbance: Secondary | ICD-10-CM | POA: Diagnosis not present

## 2015-06-12 DIAGNOSIS — R1310 Dysphagia, unspecified: Secondary | ICD-10-CM | POA: Diagnosis not present

## 2015-06-14 DIAGNOSIS — R1310 Dysphagia, unspecified: Secondary | ICD-10-CM | POA: Diagnosis not present

## 2015-06-14 DIAGNOSIS — I672 Cerebral atherosclerosis: Secondary | ICD-10-CM | POA: Diagnosis not present

## 2015-06-14 DIAGNOSIS — F0150 Vascular dementia without behavioral disturbance: Secondary | ICD-10-CM | POA: Diagnosis not present

## 2015-06-16 DIAGNOSIS — F0150 Vascular dementia without behavioral disturbance: Secondary | ICD-10-CM | POA: Diagnosis not present

## 2015-06-16 DIAGNOSIS — I672 Cerebral atherosclerosis: Secondary | ICD-10-CM | POA: Diagnosis not present

## 2015-06-16 DIAGNOSIS — R1310 Dysphagia, unspecified: Secondary | ICD-10-CM | POA: Diagnosis not present

## 2015-06-17 DIAGNOSIS — R1310 Dysphagia, unspecified: Secondary | ICD-10-CM | POA: Diagnosis not present

## 2015-06-17 DIAGNOSIS — F0150 Vascular dementia without behavioral disturbance: Secondary | ICD-10-CM | POA: Diagnosis not present

## 2015-06-17 DIAGNOSIS — I672 Cerebral atherosclerosis: Secondary | ICD-10-CM | POA: Diagnosis not present

## 2015-06-19 DIAGNOSIS — R1310 Dysphagia, unspecified: Secondary | ICD-10-CM | POA: Diagnosis not present

## 2015-06-19 DIAGNOSIS — F0150 Vascular dementia without behavioral disturbance: Secondary | ICD-10-CM | POA: Diagnosis not present

## 2015-06-19 DIAGNOSIS — I672 Cerebral atherosclerosis: Secondary | ICD-10-CM | POA: Diagnosis not present

## 2015-06-21 DIAGNOSIS — I672 Cerebral atherosclerosis: Secondary | ICD-10-CM | POA: Diagnosis not present

## 2015-06-21 DIAGNOSIS — F0150 Vascular dementia without behavioral disturbance: Secondary | ICD-10-CM | POA: Diagnosis not present

## 2015-06-21 DIAGNOSIS — R1310 Dysphagia, unspecified: Secondary | ICD-10-CM | POA: Diagnosis not present

## 2015-06-23 DIAGNOSIS — I672 Cerebral atherosclerosis: Secondary | ICD-10-CM | POA: Diagnosis not present

## 2015-06-23 DIAGNOSIS — R1310 Dysphagia, unspecified: Secondary | ICD-10-CM | POA: Diagnosis not present

## 2015-06-23 DIAGNOSIS — F0150 Vascular dementia without behavioral disturbance: Secondary | ICD-10-CM | POA: Diagnosis not present

## 2015-06-24 DIAGNOSIS — F0150 Vascular dementia without behavioral disturbance: Secondary | ICD-10-CM | POA: Diagnosis not present

## 2015-06-24 DIAGNOSIS — R1310 Dysphagia, unspecified: Secondary | ICD-10-CM | POA: Diagnosis not present

## 2015-06-24 DIAGNOSIS — I672 Cerebral atherosclerosis: Secondary | ICD-10-CM | POA: Diagnosis not present

## 2015-06-26 DIAGNOSIS — R1310 Dysphagia, unspecified: Secondary | ICD-10-CM | POA: Diagnosis not present

## 2015-06-26 DIAGNOSIS — I672 Cerebral atherosclerosis: Secondary | ICD-10-CM | POA: Diagnosis not present

## 2015-06-26 DIAGNOSIS — F0150 Vascular dementia without behavioral disturbance: Secondary | ICD-10-CM | POA: Diagnosis not present

## 2015-06-28 DIAGNOSIS — R1310 Dysphagia, unspecified: Secondary | ICD-10-CM | POA: Diagnosis not present

## 2015-06-28 DIAGNOSIS — I672 Cerebral atherosclerosis: Secondary | ICD-10-CM | POA: Diagnosis not present

## 2015-06-28 DIAGNOSIS — F0150 Vascular dementia without behavioral disturbance: Secondary | ICD-10-CM | POA: Diagnosis not present

## 2015-06-29 DIAGNOSIS — R1310 Dysphagia, unspecified: Secondary | ICD-10-CM | POA: Diagnosis not present

## 2015-06-29 DIAGNOSIS — I672 Cerebral atherosclerosis: Secondary | ICD-10-CM | POA: Diagnosis not present

## 2015-06-29 DIAGNOSIS — F0150 Vascular dementia without behavioral disturbance: Secondary | ICD-10-CM | POA: Diagnosis not present

## 2015-06-30 DIAGNOSIS — R1310 Dysphagia, unspecified: Secondary | ICD-10-CM | POA: Diagnosis not present

## 2015-06-30 DIAGNOSIS — I672 Cerebral atherosclerosis: Secondary | ICD-10-CM | POA: Diagnosis not present

## 2015-06-30 DIAGNOSIS — F0150 Vascular dementia without behavioral disturbance: Secondary | ICD-10-CM | POA: Diagnosis not present

## 2015-07-01 DIAGNOSIS — R1310 Dysphagia, unspecified: Secondary | ICD-10-CM | POA: Diagnosis not present

## 2015-07-01 DIAGNOSIS — F0150 Vascular dementia without behavioral disturbance: Secondary | ICD-10-CM | POA: Diagnosis not present

## 2015-07-01 DIAGNOSIS — I672 Cerebral atherosclerosis: Secondary | ICD-10-CM | POA: Diagnosis not present

## 2015-07-03 DIAGNOSIS — I672 Cerebral atherosclerosis: Secondary | ICD-10-CM | POA: Diagnosis not present

## 2015-07-03 DIAGNOSIS — F0150 Vascular dementia without behavioral disturbance: Secondary | ICD-10-CM | POA: Diagnosis not present

## 2015-07-03 DIAGNOSIS — R1310 Dysphagia, unspecified: Secondary | ICD-10-CM | POA: Diagnosis not present

## 2015-07-05 DIAGNOSIS — F0150 Vascular dementia without behavioral disturbance: Secondary | ICD-10-CM | POA: Diagnosis not present

## 2015-07-05 DIAGNOSIS — R1310 Dysphagia, unspecified: Secondary | ICD-10-CM | POA: Diagnosis not present

## 2015-07-05 DIAGNOSIS — I672 Cerebral atherosclerosis: Secondary | ICD-10-CM | POA: Diagnosis not present

## 2015-07-07 DIAGNOSIS — R1310 Dysphagia, unspecified: Secondary | ICD-10-CM | POA: Diagnosis not present

## 2015-07-07 DIAGNOSIS — I672 Cerebral atherosclerosis: Secondary | ICD-10-CM | POA: Diagnosis not present

## 2015-07-07 DIAGNOSIS — F0150 Vascular dementia without behavioral disturbance: Secondary | ICD-10-CM | POA: Diagnosis not present

## 2015-07-08 DIAGNOSIS — F0150 Vascular dementia without behavioral disturbance: Secondary | ICD-10-CM | POA: Diagnosis not present

## 2015-07-08 DIAGNOSIS — R1310 Dysphagia, unspecified: Secondary | ICD-10-CM | POA: Diagnosis not present

## 2015-07-08 DIAGNOSIS — I672 Cerebral atherosclerosis: Secondary | ICD-10-CM | POA: Diagnosis not present

## 2015-07-10 DIAGNOSIS — R1310 Dysphagia, unspecified: Secondary | ICD-10-CM | POA: Diagnosis not present

## 2015-07-10 DIAGNOSIS — I672 Cerebral atherosclerosis: Secondary | ICD-10-CM | POA: Diagnosis not present

## 2015-07-10 DIAGNOSIS — F0150 Vascular dementia without behavioral disturbance: Secondary | ICD-10-CM | POA: Diagnosis not present

## 2015-07-12 DIAGNOSIS — R1310 Dysphagia, unspecified: Secondary | ICD-10-CM | POA: Diagnosis not present

## 2015-07-12 DIAGNOSIS — I672 Cerebral atherosclerosis: Secondary | ICD-10-CM | POA: Diagnosis not present

## 2015-07-12 DIAGNOSIS — I10 Essential (primary) hypertension: Secondary | ICD-10-CM | POA: Diagnosis not present

## 2015-07-12 DIAGNOSIS — F0150 Vascular dementia without behavioral disturbance: Secondary | ICD-10-CM | POA: Diagnosis not present

## 2015-07-14 DIAGNOSIS — I10 Essential (primary) hypertension: Secondary | ICD-10-CM | POA: Diagnosis not present

## 2015-07-14 DIAGNOSIS — R1310 Dysphagia, unspecified: Secondary | ICD-10-CM | POA: Diagnosis not present

## 2015-07-14 DIAGNOSIS — F0150 Vascular dementia without behavioral disturbance: Secondary | ICD-10-CM | POA: Diagnosis not present

## 2015-07-14 DIAGNOSIS — I672 Cerebral atherosclerosis: Secondary | ICD-10-CM | POA: Diagnosis not present

## 2015-07-17 DIAGNOSIS — F0150 Vascular dementia without behavioral disturbance: Secondary | ICD-10-CM | POA: Diagnosis not present

## 2015-07-17 DIAGNOSIS — I672 Cerebral atherosclerosis: Secondary | ICD-10-CM | POA: Diagnosis not present

## 2015-07-17 DIAGNOSIS — R1310 Dysphagia, unspecified: Secondary | ICD-10-CM | POA: Diagnosis not present

## 2015-07-17 DIAGNOSIS — I10 Essential (primary) hypertension: Secondary | ICD-10-CM | POA: Diagnosis not present

## 2015-07-19 DIAGNOSIS — R1310 Dysphagia, unspecified: Secondary | ICD-10-CM | POA: Diagnosis not present

## 2015-07-19 DIAGNOSIS — F0150 Vascular dementia without behavioral disturbance: Secondary | ICD-10-CM | POA: Diagnosis not present

## 2015-07-19 DIAGNOSIS — I10 Essential (primary) hypertension: Secondary | ICD-10-CM | POA: Diagnosis not present

## 2015-07-19 DIAGNOSIS — I672 Cerebral atherosclerosis: Secondary | ICD-10-CM | POA: Diagnosis not present

## 2015-07-21 DIAGNOSIS — F0150 Vascular dementia without behavioral disturbance: Secondary | ICD-10-CM | POA: Diagnosis not present

## 2015-07-21 DIAGNOSIS — I672 Cerebral atherosclerosis: Secondary | ICD-10-CM | POA: Diagnosis not present

## 2015-07-21 DIAGNOSIS — I10 Essential (primary) hypertension: Secondary | ICD-10-CM | POA: Diagnosis not present

## 2015-07-21 DIAGNOSIS — R1310 Dysphagia, unspecified: Secondary | ICD-10-CM | POA: Diagnosis not present

## 2015-07-22 DIAGNOSIS — F0150 Vascular dementia without behavioral disturbance: Secondary | ICD-10-CM | POA: Diagnosis not present

## 2015-07-22 DIAGNOSIS — I672 Cerebral atherosclerosis: Secondary | ICD-10-CM | POA: Diagnosis not present

## 2015-07-22 DIAGNOSIS — R1310 Dysphagia, unspecified: Secondary | ICD-10-CM | POA: Diagnosis not present

## 2015-07-22 DIAGNOSIS — I10 Essential (primary) hypertension: Secondary | ICD-10-CM | POA: Diagnosis not present

## 2015-07-24 DIAGNOSIS — R1310 Dysphagia, unspecified: Secondary | ICD-10-CM | POA: Diagnosis not present

## 2015-07-24 DIAGNOSIS — F0150 Vascular dementia without behavioral disturbance: Secondary | ICD-10-CM | POA: Diagnosis not present

## 2015-07-24 DIAGNOSIS — I672 Cerebral atherosclerosis: Secondary | ICD-10-CM | POA: Diagnosis not present

## 2015-07-24 DIAGNOSIS — I10 Essential (primary) hypertension: Secondary | ICD-10-CM | POA: Diagnosis not present

## 2015-07-25 DIAGNOSIS — B351 Tinea unguium: Secondary | ICD-10-CM | POA: Diagnosis not present

## 2015-07-25 DIAGNOSIS — M79674 Pain in right toe(s): Secondary | ICD-10-CM | POA: Diagnosis not present

## 2015-07-27 DIAGNOSIS — F0150 Vascular dementia without behavioral disturbance: Secondary | ICD-10-CM | POA: Diagnosis not present

## 2015-07-27 DIAGNOSIS — I672 Cerebral atherosclerosis: Secondary | ICD-10-CM | POA: Diagnosis not present

## 2015-07-27 DIAGNOSIS — R1310 Dysphagia, unspecified: Secondary | ICD-10-CM | POA: Diagnosis not present

## 2015-07-27 DIAGNOSIS — I10 Essential (primary) hypertension: Secondary | ICD-10-CM | POA: Diagnosis not present

## 2015-07-28 DIAGNOSIS — I672 Cerebral atherosclerosis: Secondary | ICD-10-CM | POA: Diagnosis not present

## 2015-07-28 DIAGNOSIS — R1310 Dysphagia, unspecified: Secondary | ICD-10-CM | POA: Diagnosis not present

## 2015-07-28 DIAGNOSIS — I10 Essential (primary) hypertension: Secondary | ICD-10-CM | POA: Diagnosis not present

## 2015-07-28 DIAGNOSIS — F0150 Vascular dementia without behavioral disturbance: Secondary | ICD-10-CM | POA: Diagnosis not present

## 2015-07-29 DIAGNOSIS — I10 Essential (primary) hypertension: Secondary | ICD-10-CM | POA: Diagnosis not present

## 2015-07-29 DIAGNOSIS — F0150 Vascular dementia without behavioral disturbance: Secondary | ICD-10-CM | POA: Diagnosis not present

## 2015-07-29 DIAGNOSIS — R1310 Dysphagia, unspecified: Secondary | ICD-10-CM | POA: Diagnosis not present

## 2015-07-29 DIAGNOSIS — I672 Cerebral atherosclerosis: Secondary | ICD-10-CM | POA: Diagnosis not present

## 2015-07-31 DIAGNOSIS — I10 Essential (primary) hypertension: Secondary | ICD-10-CM | POA: Diagnosis not present

## 2015-07-31 DIAGNOSIS — F0150 Vascular dementia without behavioral disturbance: Secondary | ICD-10-CM | POA: Diagnosis not present

## 2015-07-31 DIAGNOSIS — I672 Cerebral atherosclerosis: Secondary | ICD-10-CM | POA: Diagnosis not present

## 2015-07-31 DIAGNOSIS — R1310 Dysphagia, unspecified: Secondary | ICD-10-CM | POA: Diagnosis not present

## 2015-08-02 DIAGNOSIS — R1310 Dysphagia, unspecified: Secondary | ICD-10-CM | POA: Diagnosis not present

## 2015-08-02 DIAGNOSIS — F0150 Vascular dementia without behavioral disturbance: Secondary | ICD-10-CM | POA: Diagnosis not present

## 2015-08-02 DIAGNOSIS — I672 Cerebral atherosclerosis: Secondary | ICD-10-CM | POA: Diagnosis not present

## 2015-08-02 DIAGNOSIS — I10 Essential (primary) hypertension: Secondary | ICD-10-CM | POA: Diagnosis not present

## 2015-08-04 DIAGNOSIS — I10 Essential (primary) hypertension: Secondary | ICD-10-CM | POA: Diagnosis not present

## 2015-08-04 DIAGNOSIS — R1310 Dysphagia, unspecified: Secondary | ICD-10-CM | POA: Diagnosis not present

## 2015-08-04 DIAGNOSIS — I672 Cerebral atherosclerosis: Secondary | ICD-10-CM | POA: Diagnosis not present

## 2015-08-04 DIAGNOSIS — F0150 Vascular dementia without behavioral disturbance: Secondary | ICD-10-CM | POA: Diagnosis not present

## 2015-08-05 DIAGNOSIS — I10 Essential (primary) hypertension: Secondary | ICD-10-CM | POA: Diagnosis not present

## 2015-08-05 DIAGNOSIS — I672 Cerebral atherosclerosis: Secondary | ICD-10-CM | POA: Diagnosis not present

## 2015-08-05 DIAGNOSIS — R1310 Dysphagia, unspecified: Secondary | ICD-10-CM | POA: Diagnosis not present

## 2015-08-05 DIAGNOSIS — F0150 Vascular dementia without behavioral disturbance: Secondary | ICD-10-CM | POA: Diagnosis not present

## 2015-08-07 DIAGNOSIS — I672 Cerebral atherosclerosis: Secondary | ICD-10-CM | POA: Diagnosis not present

## 2015-08-07 DIAGNOSIS — I10 Essential (primary) hypertension: Secondary | ICD-10-CM | POA: Diagnosis not present

## 2015-08-07 DIAGNOSIS — F0150 Vascular dementia without behavioral disturbance: Secondary | ICD-10-CM | POA: Diagnosis not present

## 2015-08-07 DIAGNOSIS — R1310 Dysphagia, unspecified: Secondary | ICD-10-CM | POA: Diagnosis not present

## 2015-08-09 DIAGNOSIS — F0150 Vascular dementia without behavioral disturbance: Secondary | ICD-10-CM | POA: Diagnosis not present

## 2015-08-09 DIAGNOSIS — I672 Cerebral atherosclerosis: Secondary | ICD-10-CM | POA: Diagnosis not present

## 2015-08-09 DIAGNOSIS — R1310 Dysphagia, unspecified: Secondary | ICD-10-CM | POA: Diagnosis not present

## 2015-08-09 DIAGNOSIS — I10 Essential (primary) hypertension: Secondary | ICD-10-CM | POA: Diagnosis not present

## 2015-08-11 DIAGNOSIS — R1310 Dysphagia, unspecified: Secondary | ICD-10-CM | POA: Diagnosis not present

## 2015-08-11 DIAGNOSIS — I10 Essential (primary) hypertension: Secondary | ICD-10-CM | POA: Diagnosis not present

## 2015-08-11 DIAGNOSIS — I672 Cerebral atherosclerosis: Secondary | ICD-10-CM | POA: Diagnosis not present

## 2015-08-11 DIAGNOSIS — F0150 Vascular dementia without behavioral disturbance: Secondary | ICD-10-CM | POA: Diagnosis not present

## 2015-08-12 DIAGNOSIS — R1310 Dysphagia, unspecified: Secondary | ICD-10-CM | POA: Diagnosis not present

## 2015-08-12 DIAGNOSIS — I10 Essential (primary) hypertension: Secondary | ICD-10-CM | POA: Diagnosis not present

## 2015-08-12 DIAGNOSIS — I672 Cerebral atherosclerosis: Secondary | ICD-10-CM | POA: Diagnosis not present

## 2015-08-12 DIAGNOSIS — F0150 Vascular dementia without behavioral disturbance: Secondary | ICD-10-CM | POA: Diagnosis not present

## 2015-09-11 DIAGNOSIS — I672 Cerebral atherosclerosis: Secondary | ICD-10-CM | POA: Diagnosis not present

## 2015-09-11 DIAGNOSIS — F0150 Vascular dementia without behavioral disturbance: Secondary | ICD-10-CM | POA: Diagnosis not present

## 2015-09-11 DIAGNOSIS — R1310 Dysphagia, unspecified: Secondary | ICD-10-CM | POA: Diagnosis not present

## 2015-09-11 DIAGNOSIS — I10 Essential (primary) hypertension: Secondary | ICD-10-CM | POA: Diagnosis not present

## 2015-10-12 DIAGNOSIS — I10 Essential (primary) hypertension: Secondary | ICD-10-CM | POA: Diagnosis not present

## 2015-10-12 DIAGNOSIS — I672 Cerebral atherosclerosis: Secondary | ICD-10-CM | POA: Diagnosis not present

## 2015-10-12 DIAGNOSIS — L89510 Pressure ulcer of right ankle, unstageable: Secondary | ICD-10-CM | POA: Diagnosis not present

## 2015-10-12 DIAGNOSIS — F0150 Vascular dementia without behavioral disturbance: Secondary | ICD-10-CM | POA: Diagnosis not present

## 2015-10-12 DIAGNOSIS — R1310 Dysphagia, unspecified: Secondary | ICD-10-CM | POA: Diagnosis not present

## 2015-11-12 DIAGNOSIS — L89510 Pressure ulcer of right ankle, unstageable: Secondary | ICD-10-CM | POA: Diagnosis not present

## 2015-11-12 DIAGNOSIS — I10 Essential (primary) hypertension: Secondary | ICD-10-CM | POA: Diagnosis not present

## 2015-11-12 DIAGNOSIS — I672 Cerebral atherosclerosis: Secondary | ICD-10-CM | POA: Diagnosis not present

## 2015-11-12 DIAGNOSIS — F0150 Vascular dementia without behavioral disturbance: Secondary | ICD-10-CM | POA: Diagnosis not present

## 2015-11-12 DIAGNOSIS — R1310 Dysphagia, unspecified: Secondary | ICD-10-CM | POA: Diagnosis not present

## 2015-12-10 DIAGNOSIS — L89510 Pressure ulcer of right ankle, unstageable: Secondary | ICD-10-CM | POA: Diagnosis not present

## 2015-12-10 DIAGNOSIS — F0150 Vascular dementia without behavioral disturbance: Secondary | ICD-10-CM | POA: Diagnosis not present

## 2015-12-10 DIAGNOSIS — R1310 Dysphagia, unspecified: Secondary | ICD-10-CM | POA: Diagnosis not present

## 2015-12-10 DIAGNOSIS — I672 Cerebral atherosclerosis: Secondary | ICD-10-CM | POA: Diagnosis not present

## 2015-12-10 DIAGNOSIS — I10 Essential (primary) hypertension: Secondary | ICD-10-CM | POA: Diagnosis not present

## 2015-12-12 DIAGNOSIS — M79674 Pain in right toe(s): Secondary | ICD-10-CM | POA: Diagnosis not present

## 2015-12-12 DIAGNOSIS — B351 Tinea unguium: Secondary | ICD-10-CM | POA: Diagnosis not present

## 2016-01-10 DIAGNOSIS — I10 Essential (primary) hypertension: Secondary | ICD-10-CM | POA: Diagnosis not present

## 2016-01-10 DIAGNOSIS — R1310 Dysphagia, unspecified: Secondary | ICD-10-CM | POA: Diagnosis not present

## 2016-01-10 DIAGNOSIS — L89510 Pressure ulcer of right ankle, unstageable: Secondary | ICD-10-CM | POA: Diagnosis not present

## 2016-01-10 DIAGNOSIS — F0150 Vascular dementia without behavioral disturbance: Secondary | ICD-10-CM | POA: Diagnosis not present

## 2016-01-10 DIAGNOSIS — I672 Cerebral atherosclerosis: Secondary | ICD-10-CM | POA: Diagnosis not present

## 2016-01-12 DIAGNOSIS — I672 Cerebral atherosclerosis: Secondary | ICD-10-CM | POA: Diagnosis not present

## 2016-01-12 DIAGNOSIS — F0150 Vascular dementia without behavioral disturbance: Secondary | ICD-10-CM | POA: Diagnosis not present

## 2016-01-12 DIAGNOSIS — L89510 Pressure ulcer of right ankle, unstageable: Secondary | ICD-10-CM | POA: Diagnosis not present

## 2016-01-12 DIAGNOSIS — I10 Essential (primary) hypertension: Secondary | ICD-10-CM | POA: Diagnosis not present

## 2016-01-12 DIAGNOSIS — R1310 Dysphagia, unspecified: Secondary | ICD-10-CM | POA: Diagnosis not present

## 2016-01-13 DIAGNOSIS — I672 Cerebral atherosclerosis: Secondary | ICD-10-CM | POA: Diagnosis not present

## 2016-01-13 DIAGNOSIS — R1310 Dysphagia, unspecified: Secondary | ICD-10-CM | POA: Diagnosis not present

## 2016-01-13 DIAGNOSIS — I10 Essential (primary) hypertension: Secondary | ICD-10-CM | POA: Diagnosis not present

## 2016-01-13 DIAGNOSIS — L89510 Pressure ulcer of right ankle, unstageable: Secondary | ICD-10-CM | POA: Diagnosis not present

## 2016-01-13 DIAGNOSIS — F0150 Vascular dementia without behavioral disturbance: Secondary | ICD-10-CM | POA: Diagnosis not present

## 2016-01-14 DIAGNOSIS — I10 Essential (primary) hypertension: Secondary | ICD-10-CM | POA: Diagnosis not present

## 2016-01-14 DIAGNOSIS — I672 Cerebral atherosclerosis: Secondary | ICD-10-CM | POA: Diagnosis not present

## 2016-01-14 DIAGNOSIS — R1310 Dysphagia, unspecified: Secondary | ICD-10-CM | POA: Diagnosis not present

## 2016-01-14 DIAGNOSIS — F0150 Vascular dementia without behavioral disturbance: Secondary | ICD-10-CM | POA: Diagnosis not present

## 2016-01-14 DIAGNOSIS — L89510 Pressure ulcer of right ankle, unstageable: Secondary | ICD-10-CM | POA: Diagnosis not present

## 2016-01-16 DIAGNOSIS — L89510 Pressure ulcer of right ankle, unstageable: Secondary | ICD-10-CM | POA: Diagnosis not present

## 2016-01-16 DIAGNOSIS — I10 Essential (primary) hypertension: Secondary | ICD-10-CM | POA: Diagnosis not present

## 2016-01-16 DIAGNOSIS — F0150 Vascular dementia without behavioral disturbance: Secondary | ICD-10-CM | POA: Diagnosis not present

## 2016-01-16 DIAGNOSIS — I672 Cerebral atherosclerosis: Secondary | ICD-10-CM | POA: Diagnosis not present

## 2016-01-16 DIAGNOSIS — R1310 Dysphagia, unspecified: Secondary | ICD-10-CM | POA: Diagnosis not present

## 2016-01-19 DIAGNOSIS — I672 Cerebral atherosclerosis: Secondary | ICD-10-CM | POA: Diagnosis not present

## 2016-01-19 DIAGNOSIS — I10 Essential (primary) hypertension: Secondary | ICD-10-CM | POA: Diagnosis not present

## 2016-01-19 DIAGNOSIS — L89510 Pressure ulcer of right ankle, unstageable: Secondary | ICD-10-CM | POA: Diagnosis not present

## 2016-01-19 DIAGNOSIS — F0150 Vascular dementia without behavioral disturbance: Secondary | ICD-10-CM | POA: Diagnosis not present

## 2016-01-19 DIAGNOSIS — R1310 Dysphagia, unspecified: Secondary | ICD-10-CM | POA: Diagnosis not present

## 2016-01-21 DIAGNOSIS — R1310 Dysphagia, unspecified: Secondary | ICD-10-CM | POA: Diagnosis not present

## 2016-01-21 DIAGNOSIS — I10 Essential (primary) hypertension: Secondary | ICD-10-CM | POA: Diagnosis not present

## 2016-01-21 DIAGNOSIS — L89510 Pressure ulcer of right ankle, unstageable: Secondary | ICD-10-CM | POA: Diagnosis not present

## 2016-01-21 DIAGNOSIS — I672 Cerebral atherosclerosis: Secondary | ICD-10-CM | POA: Diagnosis not present

## 2016-01-21 DIAGNOSIS — F0150 Vascular dementia without behavioral disturbance: Secondary | ICD-10-CM | POA: Diagnosis not present

## 2016-01-23 DIAGNOSIS — L89510 Pressure ulcer of right ankle, unstageable: Secondary | ICD-10-CM | POA: Diagnosis not present

## 2016-01-23 DIAGNOSIS — I672 Cerebral atherosclerosis: Secondary | ICD-10-CM | POA: Diagnosis not present

## 2016-01-23 DIAGNOSIS — R1310 Dysphagia, unspecified: Secondary | ICD-10-CM | POA: Diagnosis not present

## 2016-01-23 DIAGNOSIS — I10 Essential (primary) hypertension: Secondary | ICD-10-CM | POA: Diagnosis not present

## 2016-01-23 DIAGNOSIS — F0150 Vascular dementia without behavioral disturbance: Secondary | ICD-10-CM | POA: Diagnosis not present

## 2016-01-26 DIAGNOSIS — F0150 Vascular dementia without behavioral disturbance: Secondary | ICD-10-CM | POA: Diagnosis not present

## 2016-01-26 DIAGNOSIS — I10 Essential (primary) hypertension: Secondary | ICD-10-CM | POA: Diagnosis not present

## 2016-01-26 DIAGNOSIS — R1310 Dysphagia, unspecified: Secondary | ICD-10-CM | POA: Diagnosis not present

## 2016-01-26 DIAGNOSIS — L89510 Pressure ulcer of right ankle, unstageable: Secondary | ICD-10-CM | POA: Diagnosis not present

## 2016-01-26 DIAGNOSIS — I672 Cerebral atherosclerosis: Secondary | ICD-10-CM | POA: Diagnosis not present

## 2016-01-28 DIAGNOSIS — F0150 Vascular dementia without behavioral disturbance: Secondary | ICD-10-CM | POA: Diagnosis not present

## 2016-01-28 DIAGNOSIS — R1310 Dysphagia, unspecified: Secondary | ICD-10-CM | POA: Diagnosis not present

## 2016-01-28 DIAGNOSIS — L89510 Pressure ulcer of right ankle, unstageable: Secondary | ICD-10-CM | POA: Diagnosis not present

## 2016-01-28 DIAGNOSIS — I10 Essential (primary) hypertension: Secondary | ICD-10-CM | POA: Diagnosis not present

## 2016-01-28 DIAGNOSIS — I672 Cerebral atherosclerosis: Secondary | ICD-10-CM | POA: Diagnosis not present

## 2016-01-30 DIAGNOSIS — I672 Cerebral atherosclerosis: Secondary | ICD-10-CM | POA: Diagnosis not present

## 2016-01-30 DIAGNOSIS — L89510 Pressure ulcer of right ankle, unstageable: Secondary | ICD-10-CM | POA: Diagnosis not present

## 2016-01-30 DIAGNOSIS — R1310 Dysphagia, unspecified: Secondary | ICD-10-CM | POA: Diagnosis not present

## 2016-01-30 DIAGNOSIS — F0150 Vascular dementia without behavioral disturbance: Secondary | ICD-10-CM | POA: Diagnosis not present

## 2016-01-30 DIAGNOSIS — I10 Essential (primary) hypertension: Secondary | ICD-10-CM | POA: Diagnosis not present

## 2016-02-02 DIAGNOSIS — R1310 Dysphagia, unspecified: Secondary | ICD-10-CM | POA: Diagnosis not present

## 2016-02-02 DIAGNOSIS — I672 Cerebral atherosclerosis: Secondary | ICD-10-CM | POA: Diagnosis not present

## 2016-02-02 DIAGNOSIS — L89510 Pressure ulcer of right ankle, unstageable: Secondary | ICD-10-CM | POA: Diagnosis not present

## 2016-02-02 DIAGNOSIS — I10 Essential (primary) hypertension: Secondary | ICD-10-CM | POA: Diagnosis not present

## 2016-02-02 DIAGNOSIS — F0150 Vascular dementia without behavioral disturbance: Secondary | ICD-10-CM | POA: Diagnosis not present

## 2016-02-04 DIAGNOSIS — I10 Essential (primary) hypertension: Secondary | ICD-10-CM | POA: Diagnosis not present

## 2016-02-04 DIAGNOSIS — L89510 Pressure ulcer of right ankle, unstageable: Secondary | ICD-10-CM | POA: Diagnosis not present

## 2016-02-04 DIAGNOSIS — I672 Cerebral atherosclerosis: Secondary | ICD-10-CM | POA: Diagnosis not present

## 2016-02-04 DIAGNOSIS — F0150 Vascular dementia without behavioral disturbance: Secondary | ICD-10-CM | POA: Diagnosis not present

## 2016-02-04 DIAGNOSIS — R1310 Dysphagia, unspecified: Secondary | ICD-10-CM | POA: Diagnosis not present

## 2016-02-06 DIAGNOSIS — F0150 Vascular dementia without behavioral disturbance: Secondary | ICD-10-CM | POA: Diagnosis not present

## 2016-02-06 DIAGNOSIS — I10 Essential (primary) hypertension: Secondary | ICD-10-CM | POA: Diagnosis not present

## 2016-02-06 DIAGNOSIS — L89510 Pressure ulcer of right ankle, unstageable: Secondary | ICD-10-CM | POA: Diagnosis not present

## 2016-02-06 DIAGNOSIS — R1310 Dysphagia, unspecified: Secondary | ICD-10-CM | POA: Diagnosis not present

## 2016-02-06 DIAGNOSIS — I672 Cerebral atherosclerosis: Secondary | ICD-10-CM | POA: Diagnosis not present

## 2016-02-09 DIAGNOSIS — R1310 Dysphagia, unspecified: Secondary | ICD-10-CM | POA: Diagnosis not present

## 2016-02-09 DIAGNOSIS — L89510 Pressure ulcer of right ankle, unstageable: Secondary | ICD-10-CM | POA: Diagnosis not present

## 2016-02-09 DIAGNOSIS — F0150 Vascular dementia without behavioral disturbance: Secondary | ICD-10-CM | POA: Diagnosis not present

## 2016-02-09 DIAGNOSIS — I672 Cerebral atherosclerosis: Secondary | ICD-10-CM | POA: Diagnosis not present

## 2016-02-09 DIAGNOSIS — I10 Essential (primary) hypertension: Secondary | ICD-10-CM | POA: Diagnosis not present

## 2016-02-13 DIAGNOSIS — M79674 Pain in right toe(s): Secondary | ICD-10-CM | POA: Diagnosis not present

## 2016-02-13 DIAGNOSIS — B351 Tinea unguium: Secondary | ICD-10-CM | POA: Diagnosis not present

## 2016-03-11 DIAGNOSIS — L89510 Pressure ulcer of right ankle, unstageable: Secondary | ICD-10-CM | POA: Diagnosis not present

## 2016-03-11 DIAGNOSIS — R1310 Dysphagia, unspecified: Secondary | ICD-10-CM | POA: Diagnosis not present

## 2016-03-11 DIAGNOSIS — I10 Essential (primary) hypertension: Secondary | ICD-10-CM | POA: Diagnosis not present

## 2016-03-11 DIAGNOSIS — F0150 Vascular dementia without behavioral disturbance: Secondary | ICD-10-CM | POA: Diagnosis not present

## 2016-03-11 DIAGNOSIS — I672 Cerebral atherosclerosis: Secondary | ICD-10-CM | POA: Diagnosis not present

## 2016-03-12 DIAGNOSIS — L89510 Pressure ulcer of right ankle, unstageable: Secondary | ICD-10-CM | POA: Diagnosis not present

## 2016-03-12 DIAGNOSIS — I10 Essential (primary) hypertension: Secondary | ICD-10-CM | POA: Diagnosis not present

## 2016-03-12 DIAGNOSIS — I672 Cerebral atherosclerosis: Secondary | ICD-10-CM | POA: Diagnosis not present

## 2016-03-12 DIAGNOSIS — R1310 Dysphagia, unspecified: Secondary | ICD-10-CM | POA: Diagnosis not present

## 2016-03-12 DIAGNOSIS — F0150 Vascular dementia without behavioral disturbance: Secondary | ICD-10-CM | POA: Diagnosis not present

## 2016-03-15 DIAGNOSIS — I10 Essential (primary) hypertension: Secondary | ICD-10-CM | POA: Diagnosis not present

## 2016-03-15 DIAGNOSIS — R1310 Dysphagia, unspecified: Secondary | ICD-10-CM | POA: Diagnosis not present

## 2016-03-15 DIAGNOSIS — L89510 Pressure ulcer of right ankle, unstageable: Secondary | ICD-10-CM | POA: Diagnosis not present

## 2016-03-15 DIAGNOSIS — F0150 Vascular dementia without behavioral disturbance: Secondary | ICD-10-CM | POA: Diagnosis not present

## 2016-03-15 DIAGNOSIS — I672 Cerebral atherosclerosis: Secondary | ICD-10-CM | POA: Diagnosis not present

## 2016-03-17 DIAGNOSIS — F0150 Vascular dementia without behavioral disturbance: Secondary | ICD-10-CM | POA: Diagnosis not present

## 2016-03-17 DIAGNOSIS — I10 Essential (primary) hypertension: Secondary | ICD-10-CM | POA: Diagnosis not present

## 2016-03-17 DIAGNOSIS — R1310 Dysphagia, unspecified: Secondary | ICD-10-CM | POA: Diagnosis not present

## 2016-03-17 DIAGNOSIS — L89510 Pressure ulcer of right ankle, unstageable: Secondary | ICD-10-CM | POA: Diagnosis not present

## 2016-03-17 DIAGNOSIS — I672 Cerebral atherosclerosis: Secondary | ICD-10-CM | POA: Diagnosis not present

## 2016-03-19 DIAGNOSIS — R1310 Dysphagia, unspecified: Secondary | ICD-10-CM | POA: Diagnosis not present

## 2016-03-19 DIAGNOSIS — F0150 Vascular dementia without behavioral disturbance: Secondary | ICD-10-CM | POA: Diagnosis not present

## 2016-03-19 DIAGNOSIS — I10 Essential (primary) hypertension: Secondary | ICD-10-CM | POA: Diagnosis not present

## 2016-03-19 DIAGNOSIS — I672 Cerebral atherosclerosis: Secondary | ICD-10-CM | POA: Diagnosis not present

## 2016-03-19 DIAGNOSIS — L89510 Pressure ulcer of right ankle, unstageable: Secondary | ICD-10-CM | POA: Diagnosis not present

## 2016-03-22 DIAGNOSIS — I672 Cerebral atherosclerosis: Secondary | ICD-10-CM | POA: Diagnosis not present

## 2016-03-22 DIAGNOSIS — R1310 Dysphagia, unspecified: Secondary | ICD-10-CM | POA: Diagnosis not present

## 2016-03-22 DIAGNOSIS — I10 Essential (primary) hypertension: Secondary | ICD-10-CM | POA: Diagnosis not present

## 2016-03-22 DIAGNOSIS — F0150 Vascular dementia without behavioral disturbance: Secondary | ICD-10-CM | POA: Diagnosis not present

## 2016-03-22 DIAGNOSIS — L89510 Pressure ulcer of right ankle, unstageable: Secondary | ICD-10-CM | POA: Diagnosis not present

## 2016-03-24 DIAGNOSIS — L89510 Pressure ulcer of right ankle, unstageable: Secondary | ICD-10-CM | POA: Diagnosis not present

## 2016-03-24 DIAGNOSIS — R1310 Dysphagia, unspecified: Secondary | ICD-10-CM | POA: Diagnosis not present

## 2016-03-24 DIAGNOSIS — I10 Essential (primary) hypertension: Secondary | ICD-10-CM | POA: Diagnosis not present

## 2016-03-24 DIAGNOSIS — F0150 Vascular dementia without behavioral disturbance: Secondary | ICD-10-CM | POA: Diagnosis not present

## 2016-03-24 DIAGNOSIS — I672 Cerebral atherosclerosis: Secondary | ICD-10-CM | POA: Diagnosis not present

## 2016-03-26 DIAGNOSIS — I10 Essential (primary) hypertension: Secondary | ICD-10-CM | POA: Diagnosis not present

## 2016-03-26 DIAGNOSIS — F0150 Vascular dementia without behavioral disturbance: Secondary | ICD-10-CM | POA: Diagnosis not present

## 2016-03-26 DIAGNOSIS — R1310 Dysphagia, unspecified: Secondary | ICD-10-CM | POA: Diagnosis not present

## 2016-03-26 DIAGNOSIS — L89510 Pressure ulcer of right ankle, unstageable: Secondary | ICD-10-CM | POA: Diagnosis not present

## 2016-03-26 DIAGNOSIS — I672 Cerebral atherosclerosis: Secondary | ICD-10-CM | POA: Diagnosis not present

## 2016-03-29 DIAGNOSIS — I672 Cerebral atherosclerosis: Secondary | ICD-10-CM | POA: Diagnosis not present

## 2016-03-29 DIAGNOSIS — F0150 Vascular dementia without behavioral disturbance: Secondary | ICD-10-CM | POA: Diagnosis not present

## 2016-03-29 DIAGNOSIS — L89510 Pressure ulcer of right ankle, unstageable: Secondary | ICD-10-CM | POA: Diagnosis not present

## 2016-03-29 DIAGNOSIS — I10 Essential (primary) hypertension: Secondary | ICD-10-CM | POA: Diagnosis not present

## 2016-03-29 DIAGNOSIS — R1310 Dysphagia, unspecified: Secondary | ICD-10-CM | POA: Diagnosis not present

## 2016-03-30 DIAGNOSIS — R1310 Dysphagia, unspecified: Secondary | ICD-10-CM | POA: Diagnosis not present

## 2016-03-30 DIAGNOSIS — I672 Cerebral atherosclerosis: Secondary | ICD-10-CM | POA: Diagnosis not present

## 2016-03-30 DIAGNOSIS — I10 Essential (primary) hypertension: Secondary | ICD-10-CM | POA: Diagnosis not present

## 2016-03-30 DIAGNOSIS — L89510 Pressure ulcer of right ankle, unstageable: Secondary | ICD-10-CM | POA: Diagnosis not present

## 2016-03-30 DIAGNOSIS — F0150 Vascular dementia without behavioral disturbance: Secondary | ICD-10-CM | POA: Diagnosis not present

## 2016-03-31 DIAGNOSIS — I672 Cerebral atherosclerosis: Secondary | ICD-10-CM | POA: Diagnosis not present

## 2016-03-31 DIAGNOSIS — F0150 Vascular dementia without behavioral disturbance: Secondary | ICD-10-CM | POA: Diagnosis not present

## 2016-03-31 DIAGNOSIS — L89510 Pressure ulcer of right ankle, unstageable: Secondary | ICD-10-CM | POA: Diagnosis not present

## 2016-03-31 DIAGNOSIS — I10 Essential (primary) hypertension: Secondary | ICD-10-CM | POA: Diagnosis not present

## 2016-03-31 DIAGNOSIS — R1310 Dysphagia, unspecified: Secondary | ICD-10-CM | POA: Diagnosis not present

## 2016-04-02 DIAGNOSIS — F0150 Vascular dementia without behavioral disturbance: Secondary | ICD-10-CM | POA: Diagnosis not present

## 2016-04-02 DIAGNOSIS — R1310 Dysphagia, unspecified: Secondary | ICD-10-CM | POA: Diagnosis not present

## 2016-04-02 DIAGNOSIS — I10 Essential (primary) hypertension: Secondary | ICD-10-CM | POA: Diagnosis not present

## 2016-04-02 DIAGNOSIS — L89510 Pressure ulcer of right ankle, unstageable: Secondary | ICD-10-CM | POA: Diagnosis not present

## 2016-04-02 DIAGNOSIS — I672 Cerebral atherosclerosis: Secondary | ICD-10-CM | POA: Diagnosis not present

## 2016-04-05 DIAGNOSIS — I672 Cerebral atherosclerosis: Secondary | ICD-10-CM | POA: Diagnosis not present

## 2016-04-05 DIAGNOSIS — R1310 Dysphagia, unspecified: Secondary | ICD-10-CM | POA: Diagnosis not present

## 2016-04-05 DIAGNOSIS — I10 Essential (primary) hypertension: Secondary | ICD-10-CM | POA: Diagnosis not present

## 2016-04-05 DIAGNOSIS — F0150 Vascular dementia without behavioral disturbance: Secondary | ICD-10-CM | POA: Diagnosis not present

## 2016-04-05 DIAGNOSIS — L89510 Pressure ulcer of right ankle, unstageable: Secondary | ICD-10-CM | POA: Diagnosis not present

## 2016-04-06 DIAGNOSIS — L89510 Pressure ulcer of right ankle, unstageable: Secondary | ICD-10-CM | POA: Diagnosis not present

## 2016-04-06 DIAGNOSIS — F0150 Vascular dementia without behavioral disturbance: Secondary | ICD-10-CM | POA: Diagnosis not present

## 2016-04-06 DIAGNOSIS — R1310 Dysphagia, unspecified: Secondary | ICD-10-CM | POA: Diagnosis not present

## 2016-04-06 DIAGNOSIS — I672 Cerebral atherosclerosis: Secondary | ICD-10-CM | POA: Diagnosis not present

## 2016-04-06 DIAGNOSIS — I10 Essential (primary) hypertension: Secondary | ICD-10-CM | POA: Diagnosis not present

## 2016-04-07 DIAGNOSIS — F0150 Vascular dementia without behavioral disturbance: Secondary | ICD-10-CM | POA: Diagnosis not present

## 2016-04-07 DIAGNOSIS — L89510 Pressure ulcer of right ankle, unstageable: Secondary | ICD-10-CM | POA: Diagnosis not present

## 2016-04-07 DIAGNOSIS — I10 Essential (primary) hypertension: Secondary | ICD-10-CM | POA: Diagnosis not present

## 2016-04-07 DIAGNOSIS — R1310 Dysphagia, unspecified: Secondary | ICD-10-CM | POA: Diagnosis not present

## 2016-04-07 DIAGNOSIS — I672 Cerebral atherosclerosis: Secondary | ICD-10-CM | POA: Diagnosis not present

## 2016-04-09 DIAGNOSIS — L89510 Pressure ulcer of right ankle, unstageable: Secondary | ICD-10-CM | POA: Diagnosis not present

## 2016-04-09 DIAGNOSIS — I10 Essential (primary) hypertension: Secondary | ICD-10-CM | POA: Diagnosis not present

## 2016-04-09 DIAGNOSIS — I672 Cerebral atherosclerosis: Secondary | ICD-10-CM | POA: Diagnosis not present

## 2016-04-09 DIAGNOSIS — F0150 Vascular dementia without behavioral disturbance: Secondary | ICD-10-CM | POA: Diagnosis not present

## 2016-04-09 DIAGNOSIS — R1310 Dysphagia, unspecified: Secondary | ICD-10-CM | POA: Diagnosis not present

## 2016-04-10 DIAGNOSIS — L89510 Pressure ulcer of right ankle, unstageable: Secondary | ICD-10-CM | POA: Diagnosis not present

## 2016-04-10 DIAGNOSIS — I10 Essential (primary) hypertension: Secondary | ICD-10-CM | POA: Diagnosis not present

## 2016-04-10 DIAGNOSIS — I672 Cerebral atherosclerosis: Secondary | ICD-10-CM | POA: Diagnosis not present

## 2016-04-10 DIAGNOSIS — R1310 Dysphagia, unspecified: Secondary | ICD-10-CM | POA: Diagnosis not present

## 2016-04-10 DIAGNOSIS — F0150 Vascular dementia without behavioral disturbance: Secondary | ICD-10-CM | POA: Diagnosis not present

## 2016-05-11 DIAGNOSIS — L89510 Pressure ulcer of right ankle, unstageable: Secondary | ICD-10-CM | POA: Diagnosis not present

## 2016-05-11 DIAGNOSIS — R1310 Dysphagia, unspecified: Secondary | ICD-10-CM | POA: Diagnosis not present

## 2016-05-11 DIAGNOSIS — F0150 Vascular dementia without behavioral disturbance: Secondary | ICD-10-CM | POA: Diagnosis not present

## 2016-05-11 DIAGNOSIS — I10 Essential (primary) hypertension: Secondary | ICD-10-CM | POA: Diagnosis not present

## 2016-05-11 DIAGNOSIS — I672 Cerebral atherosclerosis: Secondary | ICD-10-CM | POA: Diagnosis not present

## 2016-05-12 DIAGNOSIS — L89510 Pressure ulcer of right ankle, unstageable: Secondary | ICD-10-CM | POA: Diagnosis not present

## 2016-05-12 DIAGNOSIS — F0150 Vascular dementia without behavioral disturbance: Secondary | ICD-10-CM | POA: Diagnosis not present

## 2016-05-12 DIAGNOSIS — I10 Essential (primary) hypertension: Secondary | ICD-10-CM | POA: Diagnosis not present

## 2016-05-12 DIAGNOSIS — R1310 Dysphagia, unspecified: Secondary | ICD-10-CM | POA: Diagnosis not present

## 2016-05-12 DIAGNOSIS — I672 Cerebral atherosclerosis: Secondary | ICD-10-CM | POA: Diagnosis not present

## 2016-05-14 DIAGNOSIS — L89510 Pressure ulcer of right ankle, unstageable: Secondary | ICD-10-CM | POA: Diagnosis not present

## 2016-05-14 DIAGNOSIS — F0150 Vascular dementia without behavioral disturbance: Secondary | ICD-10-CM | POA: Diagnosis not present

## 2016-05-14 DIAGNOSIS — I10 Essential (primary) hypertension: Secondary | ICD-10-CM | POA: Diagnosis not present

## 2016-05-14 DIAGNOSIS — I672 Cerebral atherosclerosis: Secondary | ICD-10-CM | POA: Diagnosis not present

## 2016-05-14 DIAGNOSIS — R1310 Dysphagia, unspecified: Secondary | ICD-10-CM | POA: Diagnosis not present

## 2016-05-15 DIAGNOSIS — R1310 Dysphagia, unspecified: Secondary | ICD-10-CM | POA: Diagnosis not present

## 2016-05-15 DIAGNOSIS — L89510 Pressure ulcer of right ankle, unstageable: Secondary | ICD-10-CM | POA: Diagnosis not present

## 2016-05-15 DIAGNOSIS — I10 Essential (primary) hypertension: Secondary | ICD-10-CM | POA: Diagnosis not present

## 2016-05-15 DIAGNOSIS — I672 Cerebral atherosclerosis: Secondary | ICD-10-CM | POA: Diagnosis not present

## 2016-05-15 DIAGNOSIS — F0150 Vascular dementia without behavioral disturbance: Secondary | ICD-10-CM | POA: Diagnosis not present

## 2016-05-17 DIAGNOSIS — I672 Cerebral atherosclerosis: Secondary | ICD-10-CM | POA: Diagnosis not present

## 2016-05-17 DIAGNOSIS — F0150 Vascular dementia without behavioral disturbance: Secondary | ICD-10-CM | POA: Diagnosis not present

## 2016-05-17 DIAGNOSIS — R1310 Dysphagia, unspecified: Secondary | ICD-10-CM | POA: Diagnosis not present

## 2016-05-17 DIAGNOSIS — I10 Essential (primary) hypertension: Secondary | ICD-10-CM | POA: Diagnosis not present

## 2016-05-17 DIAGNOSIS — L89510 Pressure ulcer of right ankle, unstageable: Secondary | ICD-10-CM | POA: Diagnosis not present

## 2016-05-18 DIAGNOSIS — R1310 Dysphagia, unspecified: Secondary | ICD-10-CM | POA: Diagnosis not present

## 2016-05-18 DIAGNOSIS — I672 Cerebral atherosclerosis: Secondary | ICD-10-CM | POA: Diagnosis not present

## 2016-05-18 DIAGNOSIS — L89510 Pressure ulcer of right ankle, unstageable: Secondary | ICD-10-CM | POA: Diagnosis not present

## 2016-05-18 DIAGNOSIS — I10 Essential (primary) hypertension: Secondary | ICD-10-CM | POA: Diagnosis not present

## 2016-05-18 DIAGNOSIS — F0150 Vascular dementia without behavioral disturbance: Secondary | ICD-10-CM | POA: Diagnosis not present

## 2016-05-19 DIAGNOSIS — I10 Essential (primary) hypertension: Secondary | ICD-10-CM | POA: Diagnosis not present

## 2016-05-19 DIAGNOSIS — F0150 Vascular dementia without behavioral disturbance: Secondary | ICD-10-CM | POA: Diagnosis not present

## 2016-05-19 DIAGNOSIS — L89510 Pressure ulcer of right ankle, unstageable: Secondary | ICD-10-CM | POA: Diagnosis not present

## 2016-05-19 DIAGNOSIS — I672 Cerebral atherosclerosis: Secondary | ICD-10-CM | POA: Diagnosis not present

## 2016-05-19 DIAGNOSIS — R1310 Dysphagia, unspecified: Secondary | ICD-10-CM | POA: Diagnosis not present

## 2016-05-21 DIAGNOSIS — L89510 Pressure ulcer of right ankle, unstageable: Secondary | ICD-10-CM | POA: Diagnosis not present

## 2016-05-21 DIAGNOSIS — R1310 Dysphagia, unspecified: Secondary | ICD-10-CM | POA: Diagnosis not present

## 2016-05-21 DIAGNOSIS — F0150 Vascular dementia without behavioral disturbance: Secondary | ICD-10-CM | POA: Diagnosis not present

## 2016-05-21 DIAGNOSIS — I10 Essential (primary) hypertension: Secondary | ICD-10-CM | POA: Diagnosis not present

## 2016-05-21 DIAGNOSIS — I672 Cerebral atherosclerosis: Secondary | ICD-10-CM | POA: Diagnosis not present

## 2016-05-24 DIAGNOSIS — R1310 Dysphagia, unspecified: Secondary | ICD-10-CM | POA: Diagnosis not present

## 2016-05-24 DIAGNOSIS — F0150 Vascular dementia without behavioral disturbance: Secondary | ICD-10-CM | POA: Diagnosis not present

## 2016-05-24 DIAGNOSIS — L89510 Pressure ulcer of right ankle, unstageable: Secondary | ICD-10-CM | POA: Diagnosis not present

## 2016-05-24 DIAGNOSIS — I10 Essential (primary) hypertension: Secondary | ICD-10-CM | POA: Diagnosis not present

## 2016-05-24 DIAGNOSIS — I672 Cerebral atherosclerosis: Secondary | ICD-10-CM | POA: Diagnosis not present

## 2016-05-25 DIAGNOSIS — F0150 Vascular dementia without behavioral disturbance: Secondary | ICD-10-CM | POA: Diagnosis not present

## 2016-05-25 DIAGNOSIS — I672 Cerebral atherosclerosis: Secondary | ICD-10-CM | POA: Diagnosis not present

## 2016-05-25 DIAGNOSIS — I10 Essential (primary) hypertension: Secondary | ICD-10-CM | POA: Diagnosis not present

## 2016-05-25 DIAGNOSIS — L89510 Pressure ulcer of right ankle, unstageable: Secondary | ICD-10-CM | POA: Diagnosis not present

## 2016-05-25 DIAGNOSIS — R1310 Dysphagia, unspecified: Secondary | ICD-10-CM | POA: Diagnosis not present

## 2016-05-26 DIAGNOSIS — R1310 Dysphagia, unspecified: Secondary | ICD-10-CM | POA: Diagnosis not present

## 2016-05-26 DIAGNOSIS — F0150 Vascular dementia without behavioral disturbance: Secondary | ICD-10-CM | POA: Diagnosis not present

## 2016-05-26 DIAGNOSIS — I10 Essential (primary) hypertension: Secondary | ICD-10-CM | POA: Diagnosis not present

## 2016-05-26 DIAGNOSIS — L89510 Pressure ulcer of right ankle, unstageable: Secondary | ICD-10-CM | POA: Diagnosis not present

## 2016-05-26 DIAGNOSIS — I672 Cerebral atherosclerosis: Secondary | ICD-10-CM | POA: Diagnosis not present

## 2016-05-28 DIAGNOSIS — L89510 Pressure ulcer of right ankle, unstageable: Secondary | ICD-10-CM | POA: Diagnosis not present

## 2016-05-28 DIAGNOSIS — F0150 Vascular dementia without behavioral disturbance: Secondary | ICD-10-CM | POA: Diagnosis not present

## 2016-05-28 DIAGNOSIS — I10 Essential (primary) hypertension: Secondary | ICD-10-CM | POA: Diagnosis not present

## 2016-05-28 DIAGNOSIS — R1310 Dysphagia, unspecified: Secondary | ICD-10-CM | POA: Diagnosis not present

## 2016-05-28 DIAGNOSIS — I672 Cerebral atherosclerosis: Secondary | ICD-10-CM | POA: Diagnosis not present

## 2016-05-31 DIAGNOSIS — L89510 Pressure ulcer of right ankle, unstageable: Secondary | ICD-10-CM | POA: Diagnosis not present

## 2016-05-31 DIAGNOSIS — R1310 Dysphagia, unspecified: Secondary | ICD-10-CM | POA: Diagnosis not present

## 2016-05-31 DIAGNOSIS — I672 Cerebral atherosclerosis: Secondary | ICD-10-CM | POA: Diagnosis not present

## 2016-05-31 DIAGNOSIS — I10 Essential (primary) hypertension: Secondary | ICD-10-CM | POA: Diagnosis not present

## 2016-05-31 DIAGNOSIS — F0150 Vascular dementia without behavioral disturbance: Secondary | ICD-10-CM | POA: Diagnosis not present

## 2016-06-01 DIAGNOSIS — I10 Essential (primary) hypertension: Secondary | ICD-10-CM | POA: Diagnosis not present

## 2016-06-01 DIAGNOSIS — I672 Cerebral atherosclerosis: Secondary | ICD-10-CM | POA: Diagnosis not present

## 2016-06-01 DIAGNOSIS — R1310 Dysphagia, unspecified: Secondary | ICD-10-CM | POA: Diagnosis not present

## 2016-06-01 DIAGNOSIS — F0150 Vascular dementia without behavioral disturbance: Secondary | ICD-10-CM | POA: Diagnosis not present

## 2016-06-01 DIAGNOSIS — L89510 Pressure ulcer of right ankle, unstageable: Secondary | ICD-10-CM | POA: Diagnosis not present

## 2016-06-02 DIAGNOSIS — F0150 Vascular dementia without behavioral disturbance: Secondary | ICD-10-CM | POA: Diagnosis not present

## 2016-06-02 DIAGNOSIS — I672 Cerebral atherosclerosis: Secondary | ICD-10-CM | POA: Diagnosis not present

## 2016-06-02 DIAGNOSIS — L89510 Pressure ulcer of right ankle, unstageable: Secondary | ICD-10-CM | POA: Diagnosis not present

## 2016-06-02 DIAGNOSIS — R1310 Dysphagia, unspecified: Secondary | ICD-10-CM | POA: Diagnosis not present

## 2016-06-02 DIAGNOSIS — I10 Essential (primary) hypertension: Secondary | ICD-10-CM | POA: Diagnosis not present

## 2016-06-04 DIAGNOSIS — R1310 Dysphagia, unspecified: Secondary | ICD-10-CM | POA: Diagnosis not present

## 2016-06-04 DIAGNOSIS — F0150 Vascular dementia without behavioral disturbance: Secondary | ICD-10-CM | POA: Diagnosis not present

## 2016-06-04 DIAGNOSIS — I672 Cerebral atherosclerosis: Secondary | ICD-10-CM | POA: Diagnosis not present

## 2016-06-04 DIAGNOSIS — I10 Essential (primary) hypertension: Secondary | ICD-10-CM | POA: Diagnosis not present

## 2016-06-04 DIAGNOSIS — L89510 Pressure ulcer of right ankle, unstageable: Secondary | ICD-10-CM | POA: Diagnosis not present

## 2016-06-07 DIAGNOSIS — I672 Cerebral atherosclerosis: Secondary | ICD-10-CM | POA: Diagnosis not present

## 2016-06-07 DIAGNOSIS — F0150 Vascular dementia without behavioral disturbance: Secondary | ICD-10-CM | POA: Diagnosis not present

## 2016-06-07 DIAGNOSIS — R1310 Dysphagia, unspecified: Secondary | ICD-10-CM | POA: Diagnosis not present

## 2016-06-07 DIAGNOSIS — L89510 Pressure ulcer of right ankle, unstageable: Secondary | ICD-10-CM | POA: Diagnosis not present

## 2016-06-07 DIAGNOSIS — I10 Essential (primary) hypertension: Secondary | ICD-10-CM | POA: Diagnosis not present

## 2016-06-08 DIAGNOSIS — L89510 Pressure ulcer of right ankle, unstageable: Secondary | ICD-10-CM | POA: Diagnosis not present

## 2016-06-08 DIAGNOSIS — I672 Cerebral atherosclerosis: Secondary | ICD-10-CM | POA: Diagnosis not present

## 2016-06-08 DIAGNOSIS — I10 Essential (primary) hypertension: Secondary | ICD-10-CM | POA: Diagnosis not present

## 2016-06-08 DIAGNOSIS — F0150 Vascular dementia without behavioral disturbance: Secondary | ICD-10-CM | POA: Diagnosis not present

## 2016-06-08 DIAGNOSIS — R1310 Dysphagia, unspecified: Secondary | ICD-10-CM | POA: Diagnosis not present

## 2016-06-09 DIAGNOSIS — I672 Cerebral atherosclerosis: Secondary | ICD-10-CM | POA: Diagnosis not present

## 2016-06-09 DIAGNOSIS — R1310 Dysphagia, unspecified: Secondary | ICD-10-CM | POA: Diagnosis not present

## 2016-06-09 DIAGNOSIS — F0150 Vascular dementia without behavioral disturbance: Secondary | ICD-10-CM | POA: Diagnosis not present

## 2016-06-09 DIAGNOSIS — I10 Essential (primary) hypertension: Secondary | ICD-10-CM | POA: Diagnosis not present

## 2016-06-09 DIAGNOSIS — L89510 Pressure ulcer of right ankle, unstageable: Secondary | ICD-10-CM | POA: Diagnosis not present

## 2016-06-11 DIAGNOSIS — R1310 Dysphagia, unspecified: Secondary | ICD-10-CM | POA: Diagnosis not present

## 2016-06-11 DIAGNOSIS — L89510 Pressure ulcer of right ankle, unstageable: Secondary | ICD-10-CM | POA: Diagnosis not present

## 2016-06-11 DIAGNOSIS — I10 Essential (primary) hypertension: Secondary | ICD-10-CM | POA: Diagnosis not present

## 2016-06-11 DIAGNOSIS — F0150 Vascular dementia without behavioral disturbance: Secondary | ICD-10-CM | POA: Diagnosis not present

## 2016-06-11 DIAGNOSIS — I672 Cerebral atherosclerosis: Secondary | ICD-10-CM | POA: Diagnosis not present

## 2016-07-11 DIAGNOSIS — R1310 Dysphagia, unspecified: Secondary | ICD-10-CM | POA: Diagnosis not present

## 2016-07-11 DIAGNOSIS — R64 Cachexia: Secondary | ICD-10-CM | POA: Diagnosis not present

## 2016-07-11 DIAGNOSIS — L89510 Pressure ulcer of right ankle, unstageable: Secondary | ICD-10-CM | POA: Diagnosis not present

## 2016-07-11 DIAGNOSIS — I672 Cerebral atherosclerosis: Secondary | ICD-10-CM | POA: Diagnosis not present

## 2016-07-11 DIAGNOSIS — F0150 Vascular dementia without behavioral disturbance: Secondary | ICD-10-CM | POA: Diagnosis not present

## 2016-07-11 DIAGNOSIS — I10 Essential (primary) hypertension: Secondary | ICD-10-CM | POA: Diagnosis not present

## 2016-07-12 DIAGNOSIS — L89510 Pressure ulcer of right ankle, unstageable: Secondary | ICD-10-CM | POA: Diagnosis not present

## 2016-07-12 DIAGNOSIS — I672 Cerebral atherosclerosis: Secondary | ICD-10-CM | POA: Diagnosis not present

## 2016-07-12 DIAGNOSIS — R64 Cachexia: Secondary | ICD-10-CM | POA: Diagnosis not present

## 2016-07-12 DIAGNOSIS — F0150 Vascular dementia without behavioral disturbance: Secondary | ICD-10-CM | POA: Diagnosis not present

## 2016-07-12 DIAGNOSIS — I10 Essential (primary) hypertension: Secondary | ICD-10-CM | POA: Diagnosis not present

## 2016-07-12 DIAGNOSIS — R1310 Dysphagia, unspecified: Secondary | ICD-10-CM | POA: Diagnosis not present

## 2016-07-13 DIAGNOSIS — R64 Cachexia: Secondary | ICD-10-CM | POA: Diagnosis not present

## 2016-07-13 DIAGNOSIS — I672 Cerebral atherosclerosis: Secondary | ICD-10-CM | POA: Diagnosis not present

## 2016-07-13 DIAGNOSIS — R1310 Dysphagia, unspecified: Secondary | ICD-10-CM | POA: Diagnosis not present

## 2016-07-13 DIAGNOSIS — L89510 Pressure ulcer of right ankle, unstageable: Secondary | ICD-10-CM | POA: Diagnosis not present

## 2016-07-13 DIAGNOSIS — I10 Essential (primary) hypertension: Secondary | ICD-10-CM | POA: Diagnosis not present

## 2016-07-13 DIAGNOSIS — F0150 Vascular dementia without behavioral disturbance: Secondary | ICD-10-CM | POA: Diagnosis not present

## 2016-07-14 DIAGNOSIS — R64 Cachexia: Secondary | ICD-10-CM | POA: Diagnosis not present

## 2016-07-14 DIAGNOSIS — I672 Cerebral atherosclerosis: Secondary | ICD-10-CM | POA: Diagnosis not present

## 2016-07-14 DIAGNOSIS — R1310 Dysphagia, unspecified: Secondary | ICD-10-CM | POA: Diagnosis not present

## 2016-07-14 DIAGNOSIS — L89510 Pressure ulcer of right ankle, unstageable: Secondary | ICD-10-CM | POA: Diagnosis not present

## 2016-07-14 DIAGNOSIS — F0150 Vascular dementia without behavioral disturbance: Secondary | ICD-10-CM | POA: Diagnosis not present

## 2016-07-14 DIAGNOSIS — I10 Essential (primary) hypertension: Secondary | ICD-10-CM | POA: Diagnosis not present

## 2016-07-16 DIAGNOSIS — M79675 Pain in left toe(s): Secondary | ICD-10-CM | POA: Diagnosis not present

## 2016-07-16 DIAGNOSIS — R1310 Dysphagia, unspecified: Secondary | ICD-10-CM | POA: Diagnosis not present

## 2016-07-16 DIAGNOSIS — F0150 Vascular dementia without behavioral disturbance: Secondary | ICD-10-CM | POA: Diagnosis not present

## 2016-07-16 DIAGNOSIS — B351 Tinea unguium: Secondary | ICD-10-CM | POA: Diagnosis not present

## 2016-07-16 DIAGNOSIS — L89510 Pressure ulcer of right ankle, unstageable: Secondary | ICD-10-CM | POA: Diagnosis not present

## 2016-07-16 DIAGNOSIS — I672 Cerebral atherosclerosis: Secondary | ICD-10-CM | POA: Diagnosis not present

## 2016-07-16 DIAGNOSIS — R64 Cachexia: Secondary | ICD-10-CM | POA: Diagnosis not present

## 2016-07-16 DIAGNOSIS — I10 Essential (primary) hypertension: Secondary | ICD-10-CM | POA: Diagnosis not present

## 2016-07-19 DIAGNOSIS — L89510 Pressure ulcer of right ankle, unstageable: Secondary | ICD-10-CM | POA: Diagnosis not present

## 2016-07-19 DIAGNOSIS — I672 Cerebral atherosclerosis: Secondary | ICD-10-CM | POA: Diagnosis not present

## 2016-07-19 DIAGNOSIS — R64 Cachexia: Secondary | ICD-10-CM | POA: Diagnosis not present

## 2016-07-19 DIAGNOSIS — I10 Essential (primary) hypertension: Secondary | ICD-10-CM | POA: Diagnosis not present

## 2016-07-19 DIAGNOSIS — R1310 Dysphagia, unspecified: Secondary | ICD-10-CM | POA: Diagnosis not present

## 2016-07-19 DIAGNOSIS — F0150 Vascular dementia without behavioral disturbance: Secondary | ICD-10-CM | POA: Diagnosis not present

## 2016-07-20 DIAGNOSIS — R64 Cachexia: Secondary | ICD-10-CM | POA: Diagnosis not present

## 2016-07-20 DIAGNOSIS — I10 Essential (primary) hypertension: Secondary | ICD-10-CM | POA: Diagnosis not present

## 2016-07-20 DIAGNOSIS — I672 Cerebral atherosclerosis: Secondary | ICD-10-CM | POA: Diagnosis not present

## 2016-07-20 DIAGNOSIS — R1310 Dysphagia, unspecified: Secondary | ICD-10-CM | POA: Diagnosis not present

## 2016-07-20 DIAGNOSIS — F0150 Vascular dementia without behavioral disturbance: Secondary | ICD-10-CM | POA: Diagnosis not present

## 2016-07-20 DIAGNOSIS — L89510 Pressure ulcer of right ankle, unstageable: Secondary | ICD-10-CM | POA: Diagnosis not present

## 2016-07-21 DIAGNOSIS — F0150 Vascular dementia without behavioral disturbance: Secondary | ICD-10-CM | POA: Diagnosis not present

## 2016-07-21 DIAGNOSIS — I672 Cerebral atherosclerosis: Secondary | ICD-10-CM | POA: Diagnosis not present

## 2016-07-21 DIAGNOSIS — R64 Cachexia: Secondary | ICD-10-CM | POA: Diagnosis not present

## 2016-07-21 DIAGNOSIS — L89510 Pressure ulcer of right ankle, unstageable: Secondary | ICD-10-CM | POA: Diagnosis not present

## 2016-07-21 DIAGNOSIS — I10 Essential (primary) hypertension: Secondary | ICD-10-CM | POA: Diagnosis not present

## 2016-07-21 DIAGNOSIS — R1310 Dysphagia, unspecified: Secondary | ICD-10-CM | POA: Diagnosis not present

## 2016-07-23 DIAGNOSIS — I10 Essential (primary) hypertension: Secondary | ICD-10-CM | POA: Diagnosis not present

## 2016-07-23 DIAGNOSIS — L89510 Pressure ulcer of right ankle, unstageable: Secondary | ICD-10-CM | POA: Diagnosis not present

## 2016-07-23 DIAGNOSIS — I672 Cerebral atherosclerosis: Secondary | ICD-10-CM | POA: Diagnosis not present

## 2016-07-23 DIAGNOSIS — R1310 Dysphagia, unspecified: Secondary | ICD-10-CM | POA: Diagnosis not present

## 2016-07-23 DIAGNOSIS — R64 Cachexia: Secondary | ICD-10-CM | POA: Diagnosis not present

## 2016-07-23 DIAGNOSIS — F0150 Vascular dementia without behavioral disturbance: Secondary | ICD-10-CM | POA: Diagnosis not present

## 2016-07-26 DIAGNOSIS — I10 Essential (primary) hypertension: Secondary | ICD-10-CM | POA: Diagnosis not present

## 2016-07-26 DIAGNOSIS — L89510 Pressure ulcer of right ankle, unstageable: Secondary | ICD-10-CM | POA: Diagnosis not present

## 2016-07-26 DIAGNOSIS — F0150 Vascular dementia without behavioral disturbance: Secondary | ICD-10-CM | POA: Diagnosis not present

## 2016-07-26 DIAGNOSIS — R64 Cachexia: Secondary | ICD-10-CM | POA: Diagnosis not present

## 2016-07-26 DIAGNOSIS — R1310 Dysphagia, unspecified: Secondary | ICD-10-CM | POA: Diagnosis not present

## 2016-07-26 DIAGNOSIS — I672 Cerebral atherosclerosis: Secondary | ICD-10-CM | POA: Diagnosis not present

## 2016-07-27 DIAGNOSIS — R1310 Dysphagia, unspecified: Secondary | ICD-10-CM | POA: Diagnosis not present

## 2016-07-27 DIAGNOSIS — R64 Cachexia: Secondary | ICD-10-CM | POA: Diagnosis not present

## 2016-07-27 DIAGNOSIS — I672 Cerebral atherosclerosis: Secondary | ICD-10-CM | POA: Diagnosis not present

## 2016-07-27 DIAGNOSIS — I10 Essential (primary) hypertension: Secondary | ICD-10-CM | POA: Diagnosis not present

## 2016-07-27 DIAGNOSIS — F0150 Vascular dementia without behavioral disturbance: Secondary | ICD-10-CM | POA: Diagnosis not present

## 2016-07-27 DIAGNOSIS — L89510 Pressure ulcer of right ankle, unstageable: Secondary | ICD-10-CM | POA: Diagnosis not present

## 2016-07-28 DIAGNOSIS — R64 Cachexia: Secondary | ICD-10-CM | POA: Diagnosis not present

## 2016-07-28 DIAGNOSIS — I672 Cerebral atherosclerosis: Secondary | ICD-10-CM | POA: Diagnosis not present

## 2016-07-28 DIAGNOSIS — L89510 Pressure ulcer of right ankle, unstageable: Secondary | ICD-10-CM | POA: Diagnosis not present

## 2016-07-28 DIAGNOSIS — F0150 Vascular dementia without behavioral disturbance: Secondary | ICD-10-CM | POA: Diagnosis not present

## 2016-07-28 DIAGNOSIS — R1310 Dysphagia, unspecified: Secondary | ICD-10-CM | POA: Diagnosis not present

## 2016-07-28 DIAGNOSIS — I10 Essential (primary) hypertension: Secondary | ICD-10-CM | POA: Diagnosis not present

## 2016-07-29 DIAGNOSIS — F0150 Vascular dementia without behavioral disturbance: Secondary | ICD-10-CM | POA: Diagnosis not present

## 2016-07-29 DIAGNOSIS — L89510 Pressure ulcer of right ankle, unstageable: Secondary | ICD-10-CM | POA: Diagnosis not present

## 2016-07-29 DIAGNOSIS — I10 Essential (primary) hypertension: Secondary | ICD-10-CM | POA: Diagnosis not present

## 2016-07-29 DIAGNOSIS — R1310 Dysphagia, unspecified: Secondary | ICD-10-CM | POA: Diagnosis not present

## 2016-07-29 DIAGNOSIS — R64 Cachexia: Secondary | ICD-10-CM | POA: Diagnosis not present

## 2016-07-29 DIAGNOSIS — I672 Cerebral atherosclerosis: Secondary | ICD-10-CM | POA: Diagnosis not present

## 2016-07-30 DIAGNOSIS — I10 Essential (primary) hypertension: Secondary | ICD-10-CM | POA: Diagnosis not present

## 2016-07-30 DIAGNOSIS — F0150 Vascular dementia without behavioral disturbance: Secondary | ICD-10-CM | POA: Diagnosis not present

## 2016-07-30 DIAGNOSIS — R1310 Dysphagia, unspecified: Secondary | ICD-10-CM | POA: Diagnosis not present

## 2016-07-30 DIAGNOSIS — L89510 Pressure ulcer of right ankle, unstageable: Secondary | ICD-10-CM | POA: Diagnosis not present

## 2016-07-30 DIAGNOSIS — I672 Cerebral atherosclerosis: Secondary | ICD-10-CM | POA: Diagnosis not present

## 2016-07-30 DIAGNOSIS — R64 Cachexia: Secondary | ICD-10-CM | POA: Diagnosis not present

## 2016-08-02 DIAGNOSIS — F0150 Vascular dementia without behavioral disturbance: Secondary | ICD-10-CM | POA: Diagnosis not present

## 2016-08-02 DIAGNOSIS — R1310 Dysphagia, unspecified: Secondary | ICD-10-CM | POA: Diagnosis not present

## 2016-08-02 DIAGNOSIS — I672 Cerebral atherosclerosis: Secondary | ICD-10-CM | POA: Diagnosis not present

## 2016-08-02 DIAGNOSIS — R64 Cachexia: Secondary | ICD-10-CM | POA: Diagnosis not present

## 2016-08-02 DIAGNOSIS — L89510 Pressure ulcer of right ankle, unstageable: Secondary | ICD-10-CM | POA: Diagnosis not present

## 2016-08-02 DIAGNOSIS — I10 Essential (primary) hypertension: Secondary | ICD-10-CM | POA: Diagnosis not present

## 2016-08-03 DIAGNOSIS — R1310 Dysphagia, unspecified: Secondary | ICD-10-CM | POA: Diagnosis not present

## 2016-08-03 DIAGNOSIS — F0150 Vascular dementia without behavioral disturbance: Secondary | ICD-10-CM | POA: Diagnosis not present

## 2016-08-03 DIAGNOSIS — L89510 Pressure ulcer of right ankle, unstageable: Secondary | ICD-10-CM | POA: Diagnosis not present

## 2016-08-03 DIAGNOSIS — R64 Cachexia: Secondary | ICD-10-CM | POA: Diagnosis not present

## 2016-08-03 DIAGNOSIS — I10 Essential (primary) hypertension: Secondary | ICD-10-CM | POA: Diagnosis not present

## 2016-08-03 DIAGNOSIS — I672 Cerebral atherosclerosis: Secondary | ICD-10-CM | POA: Diagnosis not present

## 2016-08-04 DIAGNOSIS — R1310 Dysphagia, unspecified: Secondary | ICD-10-CM | POA: Diagnosis not present

## 2016-08-04 DIAGNOSIS — R64 Cachexia: Secondary | ICD-10-CM | POA: Diagnosis not present

## 2016-08-04 DIAGNOSIS — I10 Essential (primary) hypertension: Secondary | ICD-10-CM | POA: Diagnosis not present

## 2016-08-04 DIAGNOSIS — I672 Cerebral atherosclerosis: Secondary | ICD-10-CM | POA: Diagnosis not present

## 2016-08-04 DIAGNOSIS — L89510 Pressure ulcer of right ankle, unstageable: Secondary | ICD-10-CM | POA: Diagnosis not present

## 2016-08-04 DIAGNOSIS — F0150 Vascular dementia without behavioral disturbance: Secondary | ICD-10-CM | POA: Diagnosis not present

## 2016-08-06 DIAGNOSIS — I672 Cerebral atherosclerosis: Secondary | ICD-10-CM | POA: Diagnosis not present

## 2016-08-06 DIAGNOSIS — F0150 Vascular dementia without behavioral disturbance: Secondary | ICD-10-CM | POA: Diagnosis not present

## 2016-08-06 DIAGNOSIS — R64 Cachexia: Secondary | ICD-10-CM | POA: Diagnosis not present

## 2016-08-06 DIAGNOSIS — L89510 Pressure ulcer of right ankle, unstageable: Secondary | ICD-10-CM | POA: Diagnosis not present

## 2016-08-06 DIAGNOSIS — I10 Essential (primary) hypertension: Secondary | ICD-10-CM | POA: Diagnosis not present

## 2016-08-06 DIAGNOSIS — R1310 Dysphagia, unspecified: Secondary | ICD-10-CM | POA: Diagnosis not present

## 2016-08-09 DIAGNOSIS — R64 Cachexia: Secondary | ICD-10-CM | POA: Diagnosis not present

## 2016-08-09 DIAGNOSIS — L89510 Pressure ulcer of right ankle, unstageable: Secondary | ICD-10-CM | POA: Diagnosis not present

## 2016-08-09 DIAGNOSIS — F0150 Vascular dementia without behavioral disturbance: Secondary | ICD-10-CM | POA: Diagnosis not present

## 2016-08-09 DIAGNOSIS — I10 Essential (primary) hypertension: Secondary | ICD-10-CM | POA: Diagnosis not present

## 2016-08-09 DIAGNOSIS — R1310 Dysphagia, unspecified: Secondary | ICD-10-CM | POA: Diagnosis not present

## 2016-08-09 DIAGNOSIS — I672 Cerebral atherosclerosis: Secondary | ICD-10-CM | POA: Diagnosis not present

## 2016-08-10 DIAGNOSIS — I10 Essential (primary) hypertension: Secondary | ICD-10-CM | POA: Diagnosis not present

## 2016-08-10 DIAGNOSIS — L89510 Pressure ulcer of right ankle, unstageable: Secondary | ICD-10-CM | POA: Diagnosis not present

## 2016-08-10 DIAGNOSIS — R1310 Dysphagia, unspecified: Secondary | ICD-10-CM | POA: Diagnosis not present

## 2016-08-10 DIAGNOSIS — R64 Cachexia: Secondary | ICD-10-CM | POA: Diagnosis not present

## 2016-08-10 DIAGNOSIS — I672 Cerebral atherosclerosis: Secondary | ICD-10-CM | POA: Diagnosis not present

## 2016-08-10 DIAGNOSIS — F0150 Vascular dementia without behavioral disturbance: Secondary | ICD-10-CM | POA: Diagnosis not present

## 2016-08-11 DIAGNOSIS — R1310 Dysphagia, unspecified: Secondary | ICD-10-CM | POA: Diagnosis not present

## 2016-08-11 DIAGNOSIS — I672 Cerebral atherosclerosis: Secondary | ICD-10-CM | POA: Diagnosis not present

## 2016-08-11 DIAGNOSIS — R64 Cachexia: Secondary | ICD-10-CM | POA: Diagnosis not present

## 2016-08-11 DIAGNOSIS — I10 Essential (primary) hypertension: Secondary | ICD-10-CM | POA: Diagnosis not present

## 2016-08-11 DIAGNOSIS — F0150 Vascular dementia without behavioral disturbance: Secondary | ICD-10-CM | POA: Diagnosis not present

## 2016-08-11 DIAGNOSIS — L89510 Pressure ulcer of right ankle, unstageable: Secondary | ICD-10-CM | POA: Diagnosis not present

## 2016-08-13 DIAGNOSIS — R64 Cachexia: Secondary | ICD-10-CM | POA: Diagnosis not present

## 2016-08-13 DIAGNOSIS — L89510 Pressure ulcer of right ankle, unstageable: Secondary | ICD-10-CM | POA: Diagnosis not present

## 2016-08-13 DIAGNOSIS — F0150 Vascular dementia without behavioral disturbance: Secondary | ICD-10-CM | POA: Diagnosis not present

## 2016-08-13 DIAGNOSIS — R1310 Dysphagia, unspecified: Secondary | ICD-10-CM | POA: Diagnosis not present

## 2016-08-13 DIAGNOSIS — I10 Essential (primary) hypertension: Secondary | ICD-10-CM | POA: Diagnosis not present

## 2016-08-13 DIAGNOSIS — I672 Cerebral atherosclerosis: Secondary | ICD-10-CM | POA: Diagnosis not present

## 2016-08-16 DIAGNOSIS — L89510 Pressure ulcer of right ankle, unstageable: Secondary | ICD-10-CM | POA: Diagnosis not present

## 2016-08-16 DIAGNOSIS — F0150 Vascular dementia without behavioral disturbance: Secondary | ICD-10-CM | POA: Diagnosis not present

## 2016-08-16 DIAGNOSIS — I672 Cerebral atherosclerosis: Secondary | ICD-10-CM | POA: Diagnosis not present

## 2016-08-16 DIAGNOSIS — R1310 Dysphagia, unspecified: Secondary | ICD-10-CM | POA: Diagnosis not present

## 2016-08-16 DIAGNOSIS — I10 Essential (primary) hypertension: Secondary | ICD-10-CM | POA: Diagnosis not present

## 2016-08-16 DIAGNOSIS — R64 Cachexia: Secondary | ICD-10-CM | POA: Diagnosis not present

## 2016-08-17 DIAGNOSIS — F0150 Vascular dementia without behavioral disturbance: Secondary | ICD-10-CM | POA: Diagnosis not present

## 2016-08-17 DIAGNOSIS — I10 Essential (primary) hypertension: Secondary | ICD-10-CM | POA: Diagnosis not present

## 2016-08-17 DIAGNOSIS — I672 Cerebral atherosclerosis: Secondary | ICD-10-CM | POA: Diagnosis not present

## 2016-08-17 DIAGNOSIS — R1310 Dysphagia, unspecified: Secondary | ICD-10-CM | POA: Diagnosis not present

## 2016-08-17 DIAGNOSIS — L89510 Pressure ulcer of right ankle, unstageable: Secondary | ICD-10-CM | POA: Diagnosis not present

## 2016-08-17 DIAGNOSIS — R64 Cachexia: Secondary | ICD-10-CM | POA: Diagnosis not present

## 2016-08-18 DIAGNOSIS — I672 Cerebral atherosclerosis: Secondary | ICD-10-CM | POA: Diagnosis not present

## 2016-08-18 DIAGNOSIS — R1310 Dysphagia, unspecified: Secondary | ICD-10-CM | POA: Diagnosis not present

## 2016-08-18 DIAGNOSIS — R64 Cachexia: Secondary | ICD-10-CM | POA: Diagnosis not present

## 2016-08-18 DIAGNOSIS — L89510 Pressure ulcer of right ankle, unstageable: Secondary | ICD-10-CM | POA: Diagnosis not present

## 2016-08-18 DIAGNOSIS — F0150 Vascular dementia without behavioral disturbance: Secondary | ICD-10-CM | POA: Diagnosis not present

## 2016-08-18 DIAGNOSIS — I10 Essential (primary) hypertension: Secondary | ICD-10-CM | POA: Diagnosis not present

## 2016-08-20 DIAGNOSIS — I10 Essential (primary) hypertension: Secondary | ICD-10-CM | POA: Diagnosis not present

## 2016-08-20 DIAGNOSIS — F0150 Vascular dementia without behavioral disturbance: Secondary | ICD-10-CM | POA: Diagnosis not present

## 2016-08-20 DIAGNOSIS — R64 Cachexia: Secondary | ICD-10-CM | POA: Diagnosis not present

## 2016-08-20 DIAGNOSIS — L89510 Pressure ulcer of right ankle, unstageable: Secondary | ICD-10-CM | POA: Diagnosis not present

## 2016-08-20 DIAGNOSIS — I672 Cerebral atherosclerosis: Secondary | ICD-10-CM | POA: Diagnosis not present

## 2016-08-20 DIAGNOSIS — R1310 Dysphagia, unspecified: Secondary | ICD-10-CM | POA: Diagnosis not present

## 2016-08-23 DIAGNOSIS — R64 Cachexia: Secondary | ICD-10-CM | POA: Diagnosis not present

## 2016-08-23 DIAGNOSIS — L89510 Pressure ulcer of right ankle, unstageable: Secondary | ICD-10-CM | POA: Diagnosis not present

## 2016-08-23 DIAGNOSIS — R1310 Dysphagia, unspecified: Secondary | ICD-10-CM | POA: Diagnosis not present

## 2016-08-23 DIAGNOSIS — F0150 Vascular dementia without behavioral disturbance: Secondary | ICD-10-CM | POA: Diagnosis not present

## 2016-08-23 DIAGNOSIS — I10 Essential (primary) hypertension: Secondary | ICD-10-CM | POA: Diagnosis not present

## 2016-08-23 DIAGNOSIS — I672 Cerebral atherosclerosis: Secondary | ICD-10-CM | POA: Diagnosis not present

## 2016-08-24 DIAGNOSIS — L89510 Pressure ulcer of right ankle, unstageable: Secondary | ICD-10-CM | POA: Diagnosis not present

## 2016-08-24 DIAGNOSIS — R1310 Dysphagia, unspecified: Secondary | ICD-10-CM | POA: Diagnosis not present

## 2016-08-24 DIAGNOSIS — F0150 Vascular dementia without behavioral disturbance: Secondary | ICD-10-CM | POA: Diagnosis not present

## 2016-08-24 DIAGNOSIS — R64 Cachexia: Secondary | ICD-10-CM | POA: Diagnosis not present

## 2016-08-24 DIAGNOSIS — I10 Essential (primary) hypertension: Secondary | ICD-10-CM | POA: Diagnosis not present

## 2016-08-24 DIAGNOSIS — I672 Cerebral atherosclerosis: Secondary | ICD-10-CM | POA: Diagnosis not present

## 2016-08-25 DIAGNOSIS — I672 Cerebral atherosclerosis: Secondary | ICD-10-CM | POA: Diagnosis not present

## 2016-08-25 DIAGNOSIS — L89510 Pressure ulcer of right ankle, unstageable: Secondary | ICD-10-CM | POA: Diagnosis not present

## 2016-08-25 DIAGNOSIS — I10 Essential (primary) hypertension: Secondary | ICD-10-CM | POA: Diagnosis not present

## 2016-08-25 DIAGNOSIS — R1310 Dysphagia, unspecified: Secondary | ICD-10-CM | POA: Diagnosis not present

## 2016-08-25 DIAGNOSIS — F0150 Vascular dementia without behavioral disturbance: Secondary | ICD-10-CM | POA: Diagnosis not present

## 2016-08-25 DIAGNOSIS — R64 Cachexia: Secondary | ICD-10-CM | POA: Diagnosis not present

## 2016-08-27 DIAGNOSIS — I672 Cerebral atherosclerosis: Secondary | ICD-10-CM | POA: Diagnosis not present

## 2016-08-27 DIAGNOSIS — L89510 Pressure ulcer of right ankle, unstageable: Secondary | ICD-10-CM | POA: Diagnosis not present

## 2016-08-27 DIAGNOSIS — F0150 Vascular dementia without behavioral disturbance: Secondary | ICD-10-CM | POA: Diagnosis not present

## 2016-08-27 DIAGNOSIS — I10 Essential (primary) hypertension: Secondary | ICD-10-CM | POA: Diagnosis not present

## 2016-08-27 DIAGNOSIS — R1310 Dysphagia, unspecified: Secondary | ICD-10-CM | POA: Diagnosis not present

## 2016-08-27 DIAGNOSIS — R64 Cachexia: Secondary | ICD-10-CM | POA: Diagnosis not present

## 2016-08-30 DIAGNOSIS — R1310 Dysphagia, unspecified: Secondary | ICD-10-CM | POA: Diagnosis not present

## 2016-08-30 DIAGNOSIS — R64 Cachexia: Secondary | ICD-10-CM | POA: Diagnosis not present

## 2016-08-30 DIAGNOSIS — L89510 Pressure ulcer of right ankle, unstageable: Secondary | ICD-10-CM | POA: Diagnosis not present

## 2016-08-30 DIAGNOSIS — I672 Cerebral atherosclerosis: Secondary | ICD-10-CM | POA: Diagnosis not present

## 2016-08-30 DIAGNOSIS — F0150 Vascular dementia without behavioral disturbance: Secondary | ICD-10-CM | POA: Diagnosis not present

## 2016-08-30 DIAGNOSIS — I10 Essential (primary) hypertension: Secondary | ICD-10-CM | POA: Diagnosis not present

## 2016-09-01 DIAGNOSIS — F0150 Vascular dementia without behavioral disturbance: Secondary | ICD-10-CM | POA: Diagnosis not present

## 2016-09-01 DIAGNOSIS — I672 Cerebral atherosclerosis: Secondary | ICD-10-CM | POA: Diagnosis not present

## 2016-09-01 DIAGNOSIS — L89510 Pressure ulcer of right ankle, unstageable: Secondary | ICD-10-CM | POA: Diagnosis not present

## 2016-09-01 DIAGNOSIS — R64 Cachexia: Secondary | ICD-10-CM | POA: Diagnosis not present

## 2016-09-01 DIAGNOSIS — R1310 Dysphagia, unspecified: Secondary | ICD-10-CM | POA: Diagnosis not present

## 2016-09-01 DIAGNOSIS — I10 Essential (primary) hypertension: Secondary | ICD-10-CM | POA: Diagnosis not present

## 2016-09-06 DIAGNOSIS — I10 Essential (primary) hypertension: Secondary | ICD-10-CM | POA: Diagnosis not present

## 2016-09-06 DIAGNOSIS — L89510 Pressure ulcer of right ankle, unstageable: Secondary | ICD-10-CM | POA: Diagnosis not present

## 2016-09-06 DIAGNOSIS — F0150 Vascular dementia without behavioral disturbance: Secondary | ICD-10-CM | POA: Diagnosis not present

## 2016-09-06 DIAGNOSIS — R64 Cachexia: Secondary | ICD-10-CM | POA: Diagnosis not present

## 2016-09-06 DIAGNOSIS — R1310 Dysphagia, unspecified: Secondary | ICD-10-CM | POA: Diagnosis not present

## 2016-09-06 DIAGNOSIS — I672 Cerebral atherosclerosis: Secondary | ICD-10-CM | POA: Diagnosis not present

## 2016-09-07 DIAGNOSIS — L89510 Pressure ulcer of right ankle, unstageable: Secondary | ICD-10-CM | POA: Diagnosis not present

## 2016-09-07 DIAGNOSIS — F0150 Vascular dementia without behavioral disturbance: Secondary | ICD-10-CM | POA: Diagnosis not present

## 2016-09-07 DIAGNOSIS — I672 Cerebral atherosclerosis: Secondary | ICD-10-CM | POA: Diagnosis not present

## 2016-09-07 DIAGNOSIS — I10 Essential (primary) hypertension: Secondary | ICD-10-CM | POA: Diagnosis not present

## 2016-09-07 DIAGNOSIS — R1310 Dysphagia, unspecified: Secondary | ICD-10-CM | POA: Diagnosis not present

## 2016-09-07 DIAGNOSIS — R64 Cachexia: Secondary | ICD-10-CM | POA: Diagnosis not present

## 2016-09-08 DIAGNOSIS — R1310 Dysphagia, unspecified: Secondary | ICD-10-CM | POA: Diagnosis not present

## 2016-09-08 DIAGNOSIS — F0150 Vascular dementia without behavioral disturbance: Secondary | ICD-10-CM | POA: Diagnosis not present

## 2016-09-08 DIAGNOSIS — L89510 Pressure ulcer of right ankle, unstageable: Secondary | ICD-10-CM | POA: Diagnosis not present

## 2016-09-08 DIAGNOSIS — I672 Cerebral atherosclerosis: Secondary | ICD-10-CM | POA: Diagnosis not present

## 2016-09-08 DIAGNOSIS — I10 Essential (primary) hypertension: Secondary | ICD-10-CM | POA: Diagnosis not present

## 2016-09-08 DIAGNOSIS — R64 Cachexia: Secondary | ICD-10-CM | POA: Diagnosis not present

## 2016-09-10 DIAGNOSIS — R1310 Dysphagia, unspecified: Secondary | ICD-10-CM | POA: Diagnosis not present

## 2016-09-10 DIAGNOSIS — I10 Essential (primary) hypertension: Secondary | ICD-10-CM | POA: Diagnosis not present

## 2016-09-10 DIAGNOSIS — I672 Cerebral atherosclerosis: Secondary | ICD-10-CM | POA: Diagnosis not present

## 2016-09-10 DIAGNOSIS — L89510 Pressure ulcer of right ankle, unstageable: Secondary | ICD-10-CM | POA: Diagnosis not present

## 2016-09-10 DIAGNOSIS — R64 Cachexia: Secondary | ICD-10-CM | POA: Diagnosis not present

## 2016-09-10 DIAGNOSIS — F0150 Vascular dementia without behavioral disturbance: Secondary | ICD-10-CM | POA: Diagnosis not present

## 2016-09-13 DIAGNOSIS — L89510 Pressure ulcer of right ankle, unstageable: Secondary | ICD-10-CM | POA: Diagnosis not present

## 2016-09-13 DIAGNOSIS — R64 Cachexia: Secondary | ICD-10-CM | POA: Diagnosis not present

## 2016-09-13 DIAGNOSIS — I10 Essential (primary) hypertension: Secondary | ICD-10-CM | POA: Diagnosis not present

## 2016-09-13 DIAGNOSIS — F0150 Vascular dementia without behavioral disturbance: Secondary | ICD-10-CM | POA: Diagnosis not present

## 2016-09-13 DIAGNOSIS — I672 Cerebral atherosclerosis: Secondary | ICD-10-CM | POA: Diagnosis not present

## 2016-09-13 DIAGNOSIS — R1310 Dysphagia, unspecified: Secondary | ICD-10-CM | POA: Diagnosis not present

## 2016-09-14 DIAGNOSIS — L89510 Pressure ulcer of right ankle, unstageable: Secondary | ICD-10-CM | POA: Diagnosis not present

## 2016-09-14 DIAGNOSIS — F0150 Vascular dementia without behavioral disturbance: Secondary | ICD-10-CM | POA: Diagnosis not present

## 2016-09-14 DIAGNOSIS — R1310 Dysphagia, unspecified: Secondary | ICD-10-CM | POA: Diagnosis not present

## 2016-09-14 DIAGNOSIS — R64 Cachexia: Secondary | ICD-10-CM | POA: Diagnosis not present

## 2016-09-14 DIAGNOSIS — I10 Essential (primary) hypertension: Secondary | ICD-10-CM | POA: Diagnosis not present

## 2016-09-14 DIAGNOSIS — I672 Cerebral atherosclerosis: Secondary | ICD-10-CM | POA: Diagnosis not present

## 2016-09-15 DIAGNOSIS — F0150 Vascular dementia without behavioral disturbance: Secondary | ICD-10-CM | POA: Diagnosis not present

## 2016-09-15 DIAGNOSIS — R1310 Dysphagia, unspecified: Secondary | ICD-10-CM | POA: Diagnosis not present

## 2016-09-15 DIAGNOSIS — I10 Essential (primary) hypertension: Secondary | ICD-10-CM | POA: Diagnosis not present

## 2016-09-15 DIAGNOSIS — R64 Cachexia: Secondary | ICD-10-CM | POA: Diagnosis not present

## 2016-09-15 DIAGNOSIS — I672 Cerebral atherosclerosis: Secondary | ICD-10-CM | POA: Diagnosis not present

## 2016-09-15 DIAGNOSIS — L89510 Pressure ulcer of right ankle, unstageable: Secondary | ICD-10-CM | POA: Diagnosis not present

## 2016-09-17 DIAGNOSIS — L89510 Pressure ulcer of right ankle, unstageable: Secondary | ICD-10-CM | POA: Diagnosis not present

## 2016-09-17 DIAGNOSIS — F0150 Vascular dementia without behavioral disturbance: Secondary | ICD-10-CM | POA: Diagnosis not present

## 2016-09-17 DIAGNOSIS — R1310 Dysphagia, unspecified: Secondary | ICD-10-CM | POA: Diagnosis not present

## 2016-09-17 DIAGNOSIS — I672 Cerebral atherosclerosis: Secondary | ICD-10-CM | POA: Diagnosis not present

## 2016-09-17 DIAGNOSIS — I10 Essential (primary) hypertension: Secondary | ICD-10-CM | POA: Diagnosis not present

## 2016-09-17 DIAGNOSIS — R64 Cachexia: Secondary | ICD-10-CM | POA: Diagnosis not present

## 2016-09-20 DIAGNOSIS — I672 Cerebral atherosclerosis: Secondary | ICD-10-CM | POA: Diagnosis not present

## 2016-09-20 DIAGNOSIS — L89510 Pressure ulcer of right ankle, unstageable: Secondary | ICD-10-CM | POA: Diagnosis not present

## 2016-09-20 DIAGNOSIS — R1310 Dysphagia, unspecified: Secondary | ICD-10-CM | POA: Diagnosis not present

## 2016-09-20 DIAGNOSIS — I10 Essential (primary) hypertension: Secondary | ICD-10-CM | POA: Diagnosis not present

## 2016-09-20 DIAGNOSIS — F0150 Vascular dementia without behavioral disturbance: Secondary | ICD-10-CM | POA: Diagnosis not present

## 2016-09-20 DIAGNOSIS — R64 Cachexia: Secondary | ICD-10-CM | POA: Diagnosis not present

## 2016-09-21 DIAGNOSIS — R1310 Dysphagia, unspecified: Secondary | ICD-10-CM | POA: Diagnosis not present

## 2016-09-21 DIAGNOSIS — I672 Cerebral atherosclerosis: Secondary | ICD-10-CM | POA: Diagnosis not present

## 2016-09-21 DIAGNOSIS — R64 Cachexia: Secondary | ICD-10-CM | POA: Diagnosis not present

## 2016-09-21 DIAGNOSIS — I10 Essential (primary) hypertension: Secondary | ICD-10-CM | POA: Diagnosis not present

## 2016-09-21 DIAGNOSIS — L89510 Pressure ulcer of right ankle, unstageable: Secondary | ICD-10-CM | POA: Diagnosis not present

## 2016-09-21 DIAGNOSIS — F0150 Vascular dementia without behavioral disturbance: Secondary | ICD-10-CM | POA: Diagnosis not present

## 2016-09-22 DIAGNOSIS — R1310 Dysphagia, unspecified: Secondary | ICD-10-CM | POA: Diagnosis not present

## 2016-09-22 DIAGNOSIS — L89510 Pressure ulcer of right ankle, unstageable: Secondary | ICD-10-CM | POA: Diagnosis not present

## 2016-09-22 DIAGNOSIS — F0150 Vascular dementia without behavioral disturbance: Secondary | ICD-10-CM | POA: Diagnosis not present

## 2016-09-22 DIAGNOSIS — R64 Cachexia: Secondary | ICD-10-CM | POA: Diagnosis not present

## 2016-09-22 DIAGNOSIS — I672 Cerebral atherosclerosis: Secondary | ICD-10-CM | POA: Diagnosis not present

## 2016-09-22 DIAGNOSIS — I10 Essential (primary) hypertension: Secondary | ICD-10-CM | POA: Diagnosis not present

## 2016-09-24 DIAGNOSIS — R1310 Dysphagia, unspecified: Secondary | ICD-10-CM | POA: Diagnosis not present

## 2016-09-24 DIAGNOSIS — R64 Cachexia: Secondary | ICD-10-CM | POA: Diagnosis not present

## 2016-09-24 DIAGNOSIS — F0150 Vascular dementia without behavioral disturbance: Secondary | ICD-10-CM | POA: Diagnosis not present

## 2016-09-24 DIAGNOSIS — L89510 Pressure ulcer of right ankle, unstageable: Secondary | ICD-10-CM | POA: Diagnosis not present

## 2016-09-24 DIAGNOSIS — I672 Cerebral atherosclerosis: Secondary | ICD-10-CM | POA: Diagnosis not present

## 2016-09-24 DIAGNOSIS — I10 Essential (primary) hypertension: Secondary | ICD-10-CM | POA: Diagnosis not present

## 2016-09-27 DIAGNOSIS — I672 Cerebral atherosclerosis: Secondary | ICD-10-CM | POA: Diagnosis not present

## 2016-09-27 DIAGNOSIS — I10 Essential (primary) hypertension: Secondary | ICD-10-CM | POA: Diagnosis not present

## 2016-09-27 DIAGNOSIS — R64 Cachexia: Secondary | ICD-10-CM | POA: Diagnosis not present

## 2016-09-27 DIAGNOSIS — L89510 Pressure ulcer of right ankle, unstageable: Secondary | ICD-10-CM | POA: Diagnosis not present

## 2016-09-27 DIAGNOSIS — F0150 Vascular dementia without behavioral disturbance: Secondary | ICD-10-CM | POA: Diagnosis not present

## 2016-09-27 DIAGNOSIS — R1310 Dysphagia, unspecified: Secondary | ICD-10-CM | POA: Diagnosis not present

## 2016-09-28 DIAGNOSIS — I10 Essential (primary) hypertension: Secondary | ICD-10-CM | POA: Diagnosis not present

## 2016-09-28 DIAGNOSIS — R64 Cachexia: Secondary | ICD-10-CM | POA: Diagnosis not present

## 2016-09-28 DIAGNOSIS — R1310 Dysphagia, unspecified: Secondary | ICD-10-CM | POA: Diagnosis not present

## 2016-09-28 DIAGNOSIS — L89510 Pressure ulcer of right ankle, unstageable: Secondary | ICD-10-CM | POA: Diagnosis not present

## 2016-09-28 DIAGNOSIS — F0150 Vascular dementia without behavioral disturbance: Secondary | ICD-10-CM | POA: Diagnosis not present

## 2016-09-28 DIAGNOSIS — I672 Cerebral atherosclerosis: Secondary | ICD-10-CM | POA: Diagnosis not present

## 2016-09-29 DIAGNOSIS — I672 Cerebral atherosclerosis: Secondary | ICD-10-CM | POA: Diagnosis not present

## 2016-09-29 DIAGNOSIS — R64 Cachexia: Secondary | ICD-10-CM | POA: Diagnosis not present

## 2016-09-29 DIAGNOSIS — I10 Essential (primary) hypertension: Secondary | ICD-10-CM | POA: Diagnosis not present

## 2016-09-29 DIAGNOSIS — R1310 Dysphagia, unspecified: Secondary | ICD-10-CM | POA: Diagnosis not present

## 2016-09-29 DIAGNOSIS — L89510 Pressure ulcer of right ankle, unstageable: Secondary | ICD-10-CM | POA: Diagnosis not present

## 2016-09-29 DIAGNOSIS — F0150 Vascular dementia without behavioral disturbance: Secondary | ICD-10-CM | POA: Diagnosis not present

## 2016-10-01 DIAGNOSIS — L89510 Pressure ulcer of right ankle, unstageable: Secondary | ICD-10-CM | POA: Diagnosis not present

## 2016-10-01 DIAGNOSIS — I10 Essential (primary) hypertension: Secondary | ICD-10-CM | POA: Diagnosis not present

## 2016-10-01 DIAGNOSIS — R64 Cachexia: Secondary | ICD-10-CM | POA: Diagnosis not present

## 2016-10-01 DIAGNOSIS — F0150 Vascular dementia without behavioral disturbance: Secondary | ICD-10-CM | POA: Diagnosis not present

## 2016-10-01 DIAGNOSIS — R1310 Dysphagia, unspecified: Secondary | ICD-10-CM | POA: Diagnosis not present

## 2016-10-01 DIAGNOSIS — I672 Cerebral atherosclerosis: Secondary | ICD-10-CM | POA: Diagnosis not present

## 2016-10-05 DIAGNOSIS — I672 Cerebral atherosclerosis: Secondary | ICD-10-CM | POA: Diagnosis not present

## 2016-10-05 DIAGNOSIS — R64 Cachexia: Secondary | ICD-10-CM | POA: Diagnosis not present

## 2016-10-05 DIAGNOSIS — R1310 Dysphagia, unspecified: Secondary | ICD-10-CM | POA: Diagnosis not present

## 2016-10-05 DIAGNOSIS — F0150 Vascular dementia without behavioral disturbance: Secondary | ICD-10-CM | POA: Diagnosis not present

## 2016-10-05 DIAGNOSIS — L89510 Pressure ulcer of right ankle, unstageable: Secondary | ICD-10-CM | POA: Diagnosis not present

## 2016-10-05 DIAGNOSIS — I10 Essential (primary) hypertension: Secondary | ICD-10-CM | POA: Diagnosis not present

## 2016-10-06 DIAGNOSIS — L89510 Pressure ulcer of right ankle, unstageable: Secondary | ICD-10-CM | POA: Diagnosis not present

## 2016-10-06 DIAGNOSIS — I10 Essential (primary) hypertension: Secondary | ICD-10-CM | POA: Diagnosis not present

## 2016-10-06 DIAGNOSIS — F0150 Vascular dementia without behavioral disturbance: Secondary | ICD-10-CM | POA: Diagnosis not present

## 2016-10-06 DIAGNOSIS — I672 Cerebral atherosclerosis: Secondary | ICD-10-CM | POA: Diagnosis not present

## 2016-10-06 DIAGNOSIS — R64 Cachexia: Secondary | ICD-10-CM | POA: Diagnosis not present

## 2016-10-06 DIAGNOSIS — R1310 Dysphagia, unspecified: Secondary | ICD-10-CM | POA: Diagnosis not present

## 2016-10-11 DIAGNOSIS — I672 Cerebral atherosclerosis: Secondary | ICD-10-CM | POA: Diagnosis not present

## 2016-10-11 DIAGNOSIS — L89510 Pressure ulcer of right ankle, unstageable: Secondary | ICD-10-CM | POA: Diagnosis not present

## 2016-10-11 DIAGNOSIS — I10 Essential (primary) hypertension: Secondary | ICD-10-CM | POA: Diagnosis not present

## 2016-10-11 DIAGNOSIS — R64 Cachexia: Secondary | ICD-10-CM | POA: Diagnosis not present

## 2016-10-11 DIAGNOSIS — R1310 Dysphagia, unspecified: Secondary | ICD-10-CM | POA: Diagnosis not present

## 2016-10-11 DIAGNOSIS — F0150 Vascular dementia without behavioral disturbance: Secondary | ICD-10-CM | POA: Diagnosis not present

## 2016-10-12 DIAGNOSIS — I10 Essential (primary) hypertension: Secondary | ICD-10-CM | POA: Diagnosis not present

## 2016-10-12 DIAGNOSIS — R1310 Dysphagia, unspecified: Secondary | ICD-10-CM | POA: Diagnosis not present

## 2016-10-12 DIAGNOSIS — R64 Cachexia: Secondary | ICD-10-CM | POA: Diagnosis not present

## 2016-10-12 DIAGNOSIS — F0150 Vascular dementia without behavioral disturbance: Secondary | ICD-10-CM | POA: Diagnosis not present

## 2016-10-12 DIAGNOSIS — L89510 Pressure ulcer of right ankle, unstageable: Secondary | ICD-10-CM | POA: Diagnosis not present

## 2016-10-12 DIAGNOSIS — I672 Cerebral atherosclerosis: Secondary | ICD-10-CM | POA: Diagnosis not present

## 2016-10-13 DIAGNOSIS — R1310 Dysphagia, unspecified: Secondary | ICD-10-CM | POA: Diagnosis not present

## 2016-10-13 DIAGNOSIS — I672 Cerebral atherosclerosis: Secondary | ICD-10-CM | POA: Diagnosis not present

## 2016-10-13 DIAGNOSIS — R64 Cachexia: Secondary | ICD-10-CM | POA: Diagnosis not present

## 2016-10-13 DIAGNOSIS — I10 Essential (primary) hypertension: Secondary | ICD-10-CM | POA: Diagnosis not present

## 2016-10-13 DIAGNOSIS — L89510 Pressure ulcer of right ankle, unstageable: Secondary | ICD-10-CM | POA: Diagnosis not present

## 2016-10-13 DIAGNOSIS — F0150 Vascular dementia without behavioral disturbance: Secondary | ICD-10-CM | POA: Diagnosis not present

## 2016-10-15 DIAGNOSIS — I10 Essential (primary) hypertension: Secondary | ICD-10-CM | POA: Diagnosis not present

## 2016-10-15 DIAGNOSIS — I672 Cerebral atherosclerosis: Secondary | ICD-10-CM | POA: Diagnosis not present

## 2016-10-15 DIAGNOSIS — R64 Cachexia: Secondary | ICD-10-CM | POA: Diagnosis not present

## 2016-10-15 DIAGNOSIS — F0150 Vascular dementia without behavioral disturbance: Secondary | ICD-10-CM | POA: Diagnosis not present

## 2016-10-15 DIAGNOSIS — R1310 Dysphagia, unspecified: Secondary | ICD-10-CM | POA: Diagnosis not present

## 2016-10-15 DIAGNOSIS — L89510 Pressure ulcer of right ankle, unstageable: Secondary | ICD-10-CM | POA: Diagnosis not present

## 2016-10-18 DIAGNOSIS — I672 Cerebral atherosclerosis: Secondary | ICD-10-CM | POA: Diagnosis not present

## 2016-10-18 DIAGNOSIS — L89510 Pressure ulcer of right ankle, unstageable: Secondary | ICD-10-CM | POA: Diagnosis not present

## 2016-10-18 DIAGNOSIS — F0150 Vascular dementia without behavioral disturbance: Secondary | ICD-10-CM | POA: Diagnosis not present

## 2016-10-18 DIAGNOSIS — R64 Cachexia: Secondary | ICD-10-CM | POA: Diagnosis not present

## 2016-10-18 DIAGNOSIS — R1310 Dysphagia, unspecified: Secondary | ICD-10-CM | POA: Diagnosis not present

## 2016-10-18 DIAGNOSIS — I10 Essential (primary) hypertension: Secondary | ICD-10-CM | POA: Diagnosis not present

## 2016-10-19 DIAGNOSIS — I10 Essential (primary) hypertension: Secondary | ICD-10-CM | POA: Diagnosis not present

## 2016-10-19 DIAGNOSIS — F0150 Vascular dementia without behavioral disturbance: Secondary | ICD-10-CM | POA: Diagnosis not present

## 2016-10-19 DIAGNOSIS — R1310 Dysphagia, unspecified: Secondary | ICD-10-CM | POA: Diagnosis not present

## 2016-10-19 DIAGNOSIS — I672 Cerebral atherosclerosis: Secondary | ICD-10-CM | POA: Diagnosis not present

## 2016-10-19 DIAGNOSIS — R64 Cachexia: Secondary | ICD-10-CM | POA: Diagnosis not present

## 2016-10-19 DIAGNOSIS — L89510 Pressure ulcer of right ankle, unstageable: Secondary | ICD-10-CM | POA: Diagnosis not present

## 2016-10-20 DIAGNOSIS — I10 Essential (primary) hypertension: Secondary | ICD-10-CM | POA: Diagnosis not present

## 2016-10-20 DIAGNOSIS — R1310 Dysphagia, unspecified: Secondary | ICD-10-CM | POA: Diagnosis not present

## 2016-10-20 DIAGNOSIS — L89510 Pressure ulcer of right ankle, unstageable: Secondary | ICD-10-CM | POA: Diagnosis not present

## 2016-10-20 DIAGNOSIS — F0150 Vascular dementia without behavioral disturbance: Secondary | ICD-10-CM | POA: Diagnosis not present

## 2016-10-20 DIAGNOSIS — I672 Cerebral atherosclerosis: Secondary | ICD-10-CM | POA: Diagnosis not present

## 2016-10-20 DIAGNOSIS — R64 Cachexia: Secondary | ICD-10-CM | POA: Diagnosis not present

## 2016-10-22 DIAGNOSIS — R1310 Dysphagia, unspecified: Secondary | ICD-10-CM | POA: Diagnosis not present

## 2016-10-22 DIAGNOSIS — I10 Essential (primary) hypertension: Secondary | ICD-10-CM | POA: Diagnosis not present

## 2016-10-22 DIAGNOSIS — F0150 Vascular dementia without behavioral disturbance: Secondary | ICD-10-CM | POA: Diagnosis not present

## 2016-10-22 DIAGNOSIS — I672 Cerebral atherosclerosis: Secondary | ICD-10-CM | POA: Diagnosis not present

## 2016-10-22 DIAGNOSIS — L89510 Pressure ulcer of right ankle, unstageable: Secondary | ICD-10-CM | POA: Diagnosis not present

## 2016-10-22 DIAGNOSIS — R64 Cachexia: Secondary | ICD-10-CM | POA: Diagnosis not present

## 2016-10-25 DIAGNOSIS — I672 Cerebral atherosclerosis: Secondary | ICD-10-CM | POA: Diagnosis not present

## 2016-10-25 DIAGNOSIS — R1310 Dysphagia, unspecified: Secondary | ICD-10-CM | POA: Diagnosis not present

## 2016-10-25 DIAGNOSIS — I10 Essential (primary) hypertension: Secondary | ICD-10-CM | POA: Diagnosis not present

## 2016-10-25 DIAGNOSIS — F0150 Vascular dementia without behavioral disturbance: Secondary | ICD-10-CM | POA: Diagnosis not present

## 2016-10-25 DIAGNOSIS — R64 Cachexia: Secondary | ICD-10-CM | POA: Diagnosis not present

## 2016-10-25 DIAGNOSIS — L89510 Pressure ulcer of right ankle, unstageable: Secondary | ICD-10-CM | POA: Diagnosis not present

## 2016-10-26 DIAGNOSIS — I10 Essential (primary) hypertension: Secondary | ICD-10-CM | POA: Diagnosis not present

## 2016-10-26 DIAGNOSIS — R64 Cachexia: Secondary | ICD-10-CM | POA: Diagnosis not present

## 2016-10-26 DIAGNOSIS — F0150 Vascular dementia without behavioral disturbance: Secondary | ICD-10-CM | POA: Diagnosis not present

## 2016-10-26 DIAGNOSIS — I672 Cerebral atherosclerosis: Secondary | ICD-10-CM | POA: Diagnosis not present

## 2016-10-26 DIAGNOSIS — R1310 Dysphagia, unspecified: Secondary | ICD-10-CM | POA: Diagnosis not present

## 2016-10-26 DIAGNOSIS — L89510 Pressure ulcer of right ankle, unstageable: Secondary | ICD-10-CM | POA: Diagnosis not present

## 2016-10-29 DIAGNOSIS — I10 Essential (primary) hypertension: Secondary | ICD-10-CM | POA: Diagnosis not present

## 2016-10-29 DIAGNOSIS — I672 Cerebral atherosclerosis: Secondary | ICD-10-CM | POA: Diagnosis not present

## 2016-10-29 DIAGNOSIS — L89510 Pressure ulcer of right ankle, unstageable: Secondary | ICD-10-CM | POA: Diagnosis not present

## 2016-10-29 DIAGNOSIS — R1310 Dysphagia, unspecified: Secondary | ICD-10-CM | POA: Diagnosis not present

## 2016-10-29 DIAGNOSIS — F0150 Vascular dementia without behavioral disturbance: Secondary | ICD-10-CM | POA: Diagnosis not present

## 2016-10-29 DIAGNOSIS — R64 Cachexia: Secondary | ICD-10-CM | POA: Diagnosis not present

## 2016-11-01 DIAGNOSIS — R64 Cachexia: Secondary | ICD-10-CM | POA: Diagnosis not present

## 2016-11-01 DIAGNOSIS — L89510 Pressure ulcer of right ankle, unstageable: Secondary | ICD-10-CM | POA: Diagnosis not present

## 2016-11-01 DIAGNOSIS — I10 Essential (primary) hypertension: Secondary | ICD-10-CM | POA: Diagnosis not present

## 2016-11-01 DIAGNOSIS — I672 Cerebral atherosclerosis: Secondary | ICD-10-CM | POA: Diagnosis not present

## 2016-11-01 DIAGNOSIS — F0150 Vascular dementia without behavioral disturbance: Secondary | ICD-10-CM | POA: Diagnosis not present

## 2016-11-01 DIAGNOSIS — R1310 Dysphagia, unspecified: Secondary | ICD-10-CM | POA: Diagnosis not present

## 2016-11-02 DIAGNOSIS — R1310 Dysphagia, unspecified: Secondary | ICD-10-CM | POA: Diagnosis not present

## 2016-11-02 DIAGNOSIS — F0150 Vascular dementia without behavioral disturbance: Secondary | ICD-10-CM | POA: Diagnosis not present

## 2016-11-02 DIAGNOSIS — I10 Essential (primary) hypertension: Secondary | ICD-10-CM | POA: Diagnosis not present

## 2016-11-02 DIAGNOSIS — I672 Cerebral atherosclerosis: Secondary | ICD-10-CM | POA: Diagnosis not present

## 2016-11-02 DIAGNOSIS — R64 Cachexia: Secondary | ICD-10-CM | POA: Diagnosis not present

## 2016-11-02 DIAGNOSIS — L89510 Pressure ulcer of right ankle, unstageable: Secondary | ICD-10-CM | POA: Diagnosis not present

## 2016-11-03 DIAGNOSIS — L89510 Pressure ulcer of right ankle, unstageable: Secondary | ICD-10-CM | POA: Diagnosis not present

## 2016-11-03 DIAGNOSIS — R64 Cachexia: Secondary | ICD-10-CM | POA: Diagnosis not present

## 2016-11-03 DIAGNOSIS — R1310 Dysphagia, unspecified: Secondary | ICD-10-CM | POA: Diagnosis not present

## 2016-11-03 DIAGNOSIS — F0150 Vascular dementia without behavioral disturbance: Secondary | ICD-10-CM | POA: Diagnosis not present

## 2016-11-03 DIAGNOSIS — I10 Essential (primary) hypertension: Secondary | ICD-10-CM | POA: Diagnosis not present

## 2016-11-03 DIAGNOSIS — I672 Cerebral atherosclerosis: Secondary | ICD-10-CM | POA: Diagnosis not present

## 2016-11-04 DIAGNOSIS — L89510 Pressure ulcer of right ankle, unstageable: Secondary | ICD-10-CM | POA: Diagnosis not present

## 2016-11-04 DIAGNOSIS — R64 Cachexia: Secondary | ICD-10-CM | POA: Diagnosis not present

## 2016-11-04 DIAGNOSIS — I10 Essential (primary) hypertension: Secondary | ICD-10-CM | POA: Diagnosis not present

## 2016-11-04 DIAGNOSIS — R1310 Dysphagia, unspecified: Secondary | ICD-10-CM | POA: Diagnosis not present

## 2016-11-04 DIAGNOSIS — I672 Cerebral atherosclerosis: Secondary | ICD-10-CM | POA: Diagnosis not present

## 2016-11-04 DIAGNOSIS — F0150 Vascular dementia without behavioral disturbance: Secondary | ICD-10-CM | POA: Diagnosis not present

## 2016-11-05 DIAGNOSIS — L89510 Pressure ulcer of right ankle, unstageable: Secondary | ICD-10-CM | POA: Diagnosis not present

## 2016-11-05 DIAGNOSIS — R1310 Dysphagia, unspecified: Secondary | ICD-10-CM | POA: Diagnosis not present

## 2016-11-05 DIAGNOSIS — F0150 Vascular dementia without behavioral disturbance: Secondary | ICD-10-CM | POA: Diagnosis not present

## 2016-11-05 DIAGNOSIS — I672 Cerebral atherosclerosis: Secondary | ICD-10-CM | POA: Diagnosis not present

## 2016-11-05 DIAGNOSIS — I10 Essential (primary) hypertension: Secondary | ICD-10-CM | POA: Diagnosis not present

## 2016-11-05 DIAGNOSIS — R64 Cachexia: Secondary | ICD-10-CM | POA: Diagnosis not present

## 2016-11-06 DIAGNOSIS — I10 Essential (primary) hypertension: Secondary | ICD-10-CM | POA: Diagnosis not present

## 2016-11-06 DIAGNOSIS — L89510 Pressure ulcer of right ankle, unstageable: Secondary | ICD-10-CM | POA: Diagnosis not present

## 2016-11-06 DIAGNOSIS — R64 Cachexia: Secondary | ICD-10-CM | POA: Diagnosis not present

## 2016-11-06 DIAGNOSIS — R1310 Dysphagia, unspecified: Secondary | ICD-10-CM | POA: Diagnosis not present

## 2016-11-06 DIAGNOSIS — I672 Cerebral atherosclerosis: Secondary | ICD-10-CM | POA: Diagnosis not present

## 2016-11-06 DIAGNOSIS — F0150 Vascular dementia without behavioral disturbance: Secondary | ICD-10-CM | POA: Diagnosis not present

## 2016-11-07 DIAGNOSIS — F0150 Vascular dementia without behavioral disturbance: Secondary | ICD-10-CM | POA: Diagnosis not present

## 2016-11-07 DIAGNOSIS — L89510 Pressure ulcer of right ankle, unstageable: Secondary | ICD-10-CM | POA: Diagnosis not present

## 2016-11-07 DIAGNOSIS — I672 Cerebral atherosclerosis: Secondary | ICD-10-CM | POA: Diagnosis not present

## 2016-11-07 DIAGNOSIS — I10 Essential (primary) hypertension: Secondary | ICD-10-CM | POA: Diagnosis not present

## 2016-11-07 DIAGNOSIS — R64 Cachexia: Secondary | ICD-10-CM | POA: Diagnosis not present

## 2016-11-07 DIAGNOSIS — R1310 Dysphagia, unspecified: Secondary | ICD-10-CM | POA: Diagnosis not present

## 2016-11-08 DIAGNOSIS — R64 Cachexia: Secondary | ICD-10-CM | POA: Diagnosis not present

## 2016-11-08 DIAGNOSIS — I10 Essential (primary) hypertension: Secondary | ICD-10-CM | POA: Diagnosis not present

## 2016-11-08 DIAGNOSIS — F0150 Vascular dementia without behavioral disturbance: Secondary | ICD-10-CM | POA: Diagnosis not present

## 2016-11-08 DIAGNOSIS — R1310 Dysphagia, unspecified: Secondary | ICD-10-CM | POA: Diagnosis not present

## 2016-11-08 DIAGNOSIS — L89510 Pressure ulcer of right ankle, unstageable: Secondary | ICD-10-CM | POA: Diagnosis not present

## 2016-11-08 DIAGNOSIS — I672 Cerebral atherosclerosis: Secondary | ICD-10-CM | POA: Diagnosis not present

## 2016-11-09 DIAGNOSIS — R1310 Dysphagia, unspecified: Secondary | ICD-10-CM | POA: Diagnosis not present

## 2016-11-09 DIAGNOSIS — L89510 Pressure ulcer of right ankle, unstageable: Secondary | ICD-10-CM | POA: Diagnosis not present

## 2016-11-09 DIAGNOSIS — I10 Essential (primary) hypertension: Secondary | ICD-10-CM | POA: Diagnosis not present

## 2016-11-09 DIAGNOSIS — I672 Cerebral atherosclerosis: Secondary | ICD-10-CM | POA: Diagnosis not present

## 2016-11-09 DIAGNOSIS — R64 Cachexia: Secondary | ICD-10-CM | POA: Diagnosis not present

## 2016-11-09 DIAGNOSIS — F0150 Vascular dementia without behavioral disturbance: Secondary | ICD-10-CM | POA: Diagnosis not present

## 2016-11-10 DIAGNOSIS — R64 Cachexia: Secondary | ICD-10-CM | POA: Diagnosis not present

## 2016-11-10 DIAGNOSIS — R1310 Dysphagia, unspecified: Secondary | ICD-10-CM | POA: Diagnosis not present

## 2016-11-10 DIAGNOSIS — I10 Essential (primary) hypertension: Secondary | ICD-10-CM | POA: Diagnosis not present

## 2016-11-10 DIAGNOSIS — F0150 Vascular dementia without behavioral disturbance: Secondary | ICD-10-CM | POA: Diagnosis not present

## 2016-11-10 DIAGNOSIS — I672 Cerebral atherosclerosis: Secondary | ICD-10-CM | POA: Diagnosis not present

## 2016-11-10 DIAGNOSIS — L89510 Pressure ulcer of right ankle, unstageable: Secondary | ICD-10-CM | POA: Diagnosis not present

## 2016-11-11 DIAGNOSIS — I10 Essential (primary) hypertension: Secondary | ICD-10-CM | POA: Diagnosis not present

## 2016-11-11 DIAGNOSIS — R1310 Dysphagia, unspecified: Secondary | ICD-10-CM | POA: Diagnosis not present

## 2016-11-11 DIAGNOSIS — I672 Cerebral atherosclerosis: Secondary | ICD-10-CM | POA: Diagnosis not present

## 2016-11-11 DIAGNOSIS — F0150 Vascular dementia without behavioral disturbance: Secondary | ICD-10-CM | POA: Diagnosis not present

## 2016-11-11 DIAGNOSIS — L89510 Pressure ulcer of right ankle, unstageable: Secondary | ICD-10-CM | POA: Diagnosis not present

## 2016-11-11 DIAGNOSIS — R64 Cachexia: Secondary | ICD-10-CM | POA: Diagnosis not present

## 2016-11-12 DIAGNOSIS — I672 Cerebral atherosclerosis: Secondary | ICD-10-CM | POA: Diagnosis not present

## 2016-11-12 DIAGNOSIS — L89510 Pressure ulcer of right ankle, unstageable: Secondary | ICD-10-CM | POA: Diagnosis not present

## 2016-11-12 DIAGNOSIS — F0150 Vascular dementia without behavioral disturbance: Secondary | ICD-10-CM | POA: Diagnosis not present

## 2016-11-12 DIAGNOSIS — R64 Cachexia: Secondary | ICD-10-CM | POA: Diagnosis not present

## 2016-11-12 DIAGNOSIS — I10 Essential (primary) hypertension: Secondary | ICD-10-CM | POA: Diagnosis not present

## 2016-11-12 DIAGNOSIS — R1310 Dysphagia, unspecified: Secondary | ICD-10-CM | POA: Diagnosis not present

## 2016-11-13 DIAGNOSIS — L89510 Pressure ulcer of right ankle, unstageable: Secondary | ICD-10-CM | POA: Diagnosis not present

## 2016-11-13 DIAGNOSIS — I10 Essential (primary) hypertension: Secondary | ICD-10-CM | POA: Diagnosis not present

## 2016-11-13 DIAGNOSIS — F0150 Vascular dementia without behavioral disturbance: Secondary | ICD-10-CM | POA: Diagnosis not present

## 2016-11-13 DIAGNOSIS — R64 Cachexia: Secondary | ICD-10-CM | POA: Diagnosis not present

## 2016-11-13 DIAGNOSIS — R1310 Dysphagia, unspecified: Secondary | ICD-10-CM | POA: Diagnosis not present

## 2016-11-13 DIAGNOSIS — I672 Cerebral atherosclerosis: Secondary | ICD-10-CM | POA: Diagnosis not present

## 2016-11-14 DIAGNOSIS — I10 Essential (primary) hypertension: Secondary | ICD-10-CM | POA: Diagnosis not present

## 2016-11-14 DIAGNOSIS — F0150 Vascular dementia without behavioral disturbance: Secondary | ICD-10-CM | POA: Diagnosis not present

## 2016-11-14 DIAGNOSIS — I672 Cerebral atherosclerosis: Secondary | ICD-10-CM | POA: Diagnosis not present

## 2016-11-14 DIAGNOSIS — R64 Cachexia: Secondary | ICD-10-CM | POA: Diagnosis not present

## 2016-11-14 DIAGNOSIS — R1310 Dysphagia, unspecified: Secondary | ICD-10-CM | POA: Diagnosis not present

## 2016-11-14 DIAGNOSIS — L89510 Pressure ulcer of right ankle, unstageable: Secondary | ICD-10-CM | POA: Diagnosis not present

## 2016-12-09 DEATH — deceased
